# Patient Record
Sex: Female | Born: 1940
Health system: Southern US, Community
[De-identification: ages and names within clinical notes are randomized; demographics above are authoritative.]

## PROBLEM LIST (undated history)

## (undated) DIAGNOSIS — E0500 Thyrotoxicosis with diffuse goiter without thyrotoxic crisis or storm: Secondary | ICD-10-CM

## (undated) DIAGNOSIS — I341 Nonrheumatic mitral (valve) prolapse: Secondary | ICD-10-CM

## (undated) DIAGNOSIS — I4891 Unspecified atrial fibrillation: Secondary | ICD-10-CM

## (undated) DIAGNOSIS — E785 Hyperlipidemia, unspecified: Secondary | ICD-10-CM

## (undated) DIAGNOSIS — L719 Rosacea, unspecified: Secondary | ICD-10-CM

## (undated) DIAGNOSIS — K219 Gastro-esophageal reflux disease without esophagitis: Secondary | ICD-10-CM

## (undated) DIAGNOSIS — K635 Polyp of colon: Secondary | ICD-10-CM

## (undated) DIAGNOSIS — D132 Benign neoplasm of duodenum: Secondary | ICD-10-CM

## (undated) DIAGNOSIS — R413 Other amnesia: Secondary | ICD-10-CM

## (undated) DIAGNOSIS — R443 Hallucinations, unspecified: Secondary | ICD-10-CM

## (undated) DIAGNOSIS — I10 Essential (primary) hypertension: Secondary | ICD-10-CM

## (undated) DIAGNOSIS — E039 Hypothyroidism, unspecified: Secondary | ICD-10-CM

## (undated) HISTORY — PX: ESOPHAGOGASTRODUODENOSCOPY: SHX1529

## (undated) HISTORY — DX: Hypothyroidism, unspecified: E03.9

## (undated) HISTORY — PX: CATARACT EXTRACTION, BILATERAL: SHX1313

## (undated) HISTORY — DX: Hyperlipidemia, unspecified: E78.5

## (undated) HISTORY — DX: Gastro-esophageal reflux disease without esophagitis: K21.9

## (undated) HISTORY — DX: Nonrheumatic mitral (valve) prolapse: I34.1

## (undated) HISTORY — DX: Benign neoplasm of duodenum: D13.2

## (undated) HISTORY — PX: OTHER SURGICAL HISTORY: SHX169

## (undated) HISTORY — DX: Thyrotoxicosis with diffuse goiter without thyrotoxic crisis or storm: E05.00

## (undated) HISTORY — DX: Rosacea, unspecified: L71.9

## (undated) HISTORY — PX: COLONOSCOPY: SHX174

## (undated) HISTORY — DX: Polyp of colon: K63.5

## (undated) HISTORY — PX: APPENDECTOMY: SHX54

## (undated) HISTORY — PX: TUBAL LIGATION: SHX77

## (undated) HISTORY — PX: PILONIDAL CYST / SINUS EXCISION: SUR543

## (undated) HISTORY — DX: Other amnesia: R41.3

---
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
## 1998-08-05 ENCOUNTER — Other Ambulatory Visit: Admission: RE | Admit: 1998-08-05 | Discharge: 1998-08-05 | Payer: Self-pay | Admitting: Obstetrics and Gynecology

## 1999-02-16 ENCOUNTER — Ambulatory Visit (HOSPITAL_COMMUNITY): Admission: RE | Admit: 1999-02-16 | Discharge: 1999-02-16 | Payer: Self-pay | Admitting: Gastroenterology

## 2000-08-27 ENCOUNTER — Other Ambulatory Visit: Admission: RE | Admit: 2000-08-27 | Discharge: 2000-08-27 | Payer: Self-pay | Admitting: Obstetrics and Gynecology

## 2002-12-04 ENCOUNTER — Other Ambulatory Visit: Admission: RE | Admit: 2002-12-04 | Discharge: 2002-12-04 | Payer: Self-pay | Admitting: Obstetrics and Gynecology

## 2004-01-11 ENCOUNTER — Other Ambulatory Visit: Admission: RE | Admit: 2004-01-11 | Discharge: 2004-01-11 | Payer: Self-pay | Admitting: Obstetrics and Gynecology

## 2005-02-22 ENCOUNTER — Ambulatory Visit: Payer: Self-pay | Admitting: Gastroenterology

## 2005-03-01 ENCOUNTER — Ambulatory Visit (HOSPITAL_COMMUNITY): Admission: RE | Admit: 2005-03-01 | Discharge: 2005-03-01 | Payer: Self-pay | Admitting: Gastroenterology

## 2005-04-11 ENCOUNTER — Ambulatory Visit: Payer: Self-pay | Admitting: Gastroenterology

## 2005-04-19 ENCOUNTER — Ambulatory Visit: Payer: Self-pay | Admitting: Gastroenterology

## 2006-06-14 ENCOUNTER — Ambulatory Visit: Payer: Self-pay | Admitting: Gastroenterology

## 2006-07-11 ENCOUNTER — Encounter: Admission: RE | Admit: 2006-07-11 | Discharge: 2006-10-09 | Payer: Self-pay | Admitting: Gastroenterology

## 2007-06-26 ENCOUNTER — Other Ambulatory Visit: Admission: RE | Admit: 2007-06-26 | Discharge: 2007-06-26 | Payer: Self-pay | Admitting: Family Medicine

## 2008-09-14 ENCOUNTER — Encounter: Admission: RE | Admit: 2008-09-14 | Discharge: 2008-09-14 | Payer: Self-pay | Admitting: Family Medicine

## 2008-09-21 ENCOUNTER — Encounter: Admission: RE | Admit: 2008-09-21 | Discharge: 2008-09-21 | Payer: Self-pay | Admitting: Family Medicine

## 2008-10-22 ENCOUNTER — Ambulatory Visit (HOSPITAL_COMMUNITY): Admission: RE | Admit: 2008-10-22 | Discharge: 2008-10-22 | Payer: Self-pay | Admitting: Gastroenterology

## 2008-10-22 ENCOUNTER — Encounter (INDEPENDENT_AMBULATORY_CARE_PROVIDER_SITE_OTHER): Payer: Self-pay | Admitting: Gastroenterology

## 2010-01-12 ENCOUNTER — Encounter: Admission: RE | Admit: 2010-01-12 | Discharge: 2010-01-12 | Payer: Self-pay | Admitting: Gastroenterology

## 2011-02-14 NOTE — Op Note (Signed)
[name], Tracy                 ACCOUNT NO.:  [id number]   MEDICAL RECORD NO.:  [id number]          PATIENT TYPE:  AMB   LOCATION:  ENDO                         FACILITY:  [facility]   PHYSICIAN:  Bernette Redbird, M.D.   DATE OF BIRTH:  1941-04-11   DATE OF PROCEDURE:  10/22/2008  DATE OF DISCHARGE:                               OPERATIVE REPORT   PROCEDURE:  Upper endoscopy with biopsy and Bravo capsule placement.   INDICATIONS:  A 70 year old female with extreme reflux-type symptoms  which have not responded to PPI therapy.   FINDINGS:  Small duodenal polyp.   PROCEDURE:  The patient provided written consent for the procedure.  Sedation was fentanyl 75 mcg and Versed 7.5 mg IV.  The Pentax video  endoscope was passed under direct vision.  The larynx and vocal cords  looked normal.  The esophagus was entered without significant  difficulty, under direct vision, and the scope was advanced down a  normal-appearing esophagus.  There was no evidence of any hiatal hernia,  free reflux, reflux esophagitis, Barrett's esophagus, varices,  infection, neoplasia, ring or stricture.   The stomach contained a small clear residual which was suctioned up.  No  bile was seen.  The gastric mucosa was unremarkable, without evidence of  gastritis, erosions, ulcers, polyps or masses or vascular ectasia, and a  retroflexed view of the cardia was unremarkable.   The pylorus and duodenal bulb looked normal.  The second duodenum had a  small 3 x 4-mm sessile pale polyp on the edge of a fold which I biopsied  x2 to essentially complete excision.  The remainder of the second  portion of the duodenum was normal.   The scope was removed from the patient, measuring the lower esophageal  sphincter at 40 cm prior to withdrawal of the scope.   Bravo capsule placement was then performed in the standard fashion.  The  introducing device was marked at 34 cm and introduced blindly into the  esophagus and advanced  to the appropriate length.  The patient was  reendoscoped into the hypopharynx to confirm intra-esophageal location  of the introducing catheter.  Suction was then applied.  At this time,  the introducer slipped back about 2 cm, but it was felt appropriate to  carry on with the procedure rather than trying to reposition once  suction had been applied.  After 45 seconds of suctioning, the  monitoring capsule was deployed successfully, and the introducing device  was withdrawn.  The patient was reendoscoped under direct vision which  confirmed appropriate attachment of the monitoring Bravo capsule to the  esophageal wall, and the scope was then removed from the patient who  tolerated the procedure well and without apparent complication.   IMPRESSION:  1. History of epigastric pain which is of reflux character but without      endoscopic evidence of adverse reflux-related sequelae.  2. No predisposing factors for reflux identified endoscopically, such      as hiatal hernia, gastric residual, pyloric channel stenosis, etc.  3. Small duodenal polyp, biopsied.   PLAN:  1.  Await pathology on duodenal polyp.  It is endoscopically felt to be      of any significance clinically.  2. Performed Bravo capsule pH monitoring for the next 48 hours with      the patient off all antipeptic therapy.  3. Proceed to screening colonoscopy.           ______________________________  Bernette Redbird, M.D.     RB/MEDQ  D:  10/22/2008  T:  10/22/2008  Job:  [id number]   cc:   Sigmund Hazel, M.D.  Fax: [phone]

## 2011-02-14 NOTE — Op Note (Signed)
NAMENAKKIA, MACKIEWICZ                 ACCOUNT NO.:  [id number]   MEDICAL RECORD NO.:  [id number]          PATIENT TYPE:  AMB   LOCATION:  ENDO                         [facility]ACILITY:  [facility]   PHYSICIAN:  Bernette Redbird, M.D.   DATE OF BIRTH:  1941-08-26   DATE OF PROCEDURE:  10/22/2008  DATE OF DISCHARGE:                               OPERATIVE REPORT   PROCEDURE:  Colonoscopy with polypectomy.   INDICATIONS:  A 70 year old female with diffuse abdominal pain and need  for colon cancer screening.   FINDINGS:  Two small polyps removed.   PROCEDURE:  The patient provided written consent for the procedure.  Total sedation for this procedure and the upper endoscopy which preceded  it was fentanyl 125 mcg and Versed 12.5 mg IV without clinical  instability.  The Pentax adult video colonoscope was advanced without  significant difficulty around the colon to the terminal ileum which had  a normal appearance, and pullback was then performed.  The quality of  prep was excellent, and it is felt that all areas were well seen.   Adjacent to the appendiceal orifice was a 3 x 4-mm sessile polyp removed  by cold snare technique.  The pieces were retrieved by suctioning  through the scope.   In the proximal descending colon was a 3 x 4-mm sessile polyp again  removed by cold snare technique.  No other polyps were seen, and there  was no evidence of cancer, colitis, vascular malformations or  diverticulosis in the colon.  Retroflexion of the rectum and  reinspection of the rectum were unremarkable.  The patient tolerated the  procedure well, and there were no apparent complications.   IMPRESSION:  1. Two small colon polyps removed as described above.  2. No source of diffuse nonspecific abdominal pain identified.   PLAN:  Await polyp pathology with colonoscopic follow-up in 5 years if  either of polyps is adenomatous in character.           ______________________________  Bernette Redbird,  M.D.     RB/MEDQ  D:  10/22/2008  T:  10/22/2008  Job:  [id number]

## 2011-02-14 NOTE — Op Note (Signed)
Tracy [name], Tracy [name]                 ACCOUNT NO.:  [id number]   MEDICAL RECORD NO.:  [id number]          PATIENT TYPE:  AMB   LOCATION:  ENDO                         FACILITY:  [facility]   PHYSICIAN:  Bernette Redbird, M.D.   DATE OF BIRTH:  10-Nov-1940   DATE OF PROCEDURE:  10/22/2008  DATE OF DISCHARGE:  10/22/2008                               OPERATIVE REPORT   PROCEDURE:  48-hour intra-esophageal pH monitoring via Bravo capsule,  off medication.   INDICATION:  Clarification of origin of patient's symptoms.   FINDINGS:  Mildly abnormal reflux score on day 1.   PROCEDURE:  The Bravo capsule was placed in the esophagus in the  standard fashion and its position was confirmed endoscopically.   This procedure was done with the patient off all antipeptic therapy for  approximately 5 days.  She had previously been on H2 blockers, not  proton pump inhibitors, in the period of time closest to this  examination.   FINDINGS:  On day 1, the patient had a DeMeester score of 20.8 (normal  less than 14.72), with a 35-minute episode of reflux while supine.  The  predominant portion of the reflux on day 1 was in the supine position,  with a total reflux time of 4.9% (7.2% while in the supine position  versus 1.9 while upright).   On day 2, the patient had a normal DeMeester score of 5.8.  On the  second day, the reflux was primarily while the patient was upright.   IMPRESSION:  This study supports the idea that the patient is indeed  having intermittent reflux, which could well account for her reflux-like  symptoms.   PLAN:  The patient will almost certainly respond to medical therapy; it  will be a process of trial and error to find the best regimen to her.           ______________________________  Bernette Redbird, M.D.     RB/MEDQ  D:  10/30/2008  T:  10/30/2008  Job:  [id number]   cc:   Sigmund Hazel, M.D.  Fax: [phone]

## 2011-02-17 NOTE — Assessment & Plan Note (Signed)
[facility]                           GASTROENTEROLOGY OFFICE NOTE   NAME:[name], Tracy [name]                        MRN:          [id number]  DATE:06/14/2006                            DOB:          Feb 21, 1941    Tracy [name] is doing well with her acid reflux on ranitidine 300 mg twice a day.  She uses Librax p.r.n. for IBS complaints.   PHYSICAL EXAMINATION:  Exam today shows her to have gained 20 pounds of  weight, and we will refer her to [facility] for weight loss  and exercise program.  She is followed by Dr. Idell Pickles, and I will see her on  a p.r.n. basis as needed.                                   Tracy Rea. Jarold Motto, MD, [place]een [place], [place]   DRP/MedQ  DD:  06/14/2006  DT:  06/15/2006  Job #:  [id number]   cc:   Dellis Anes. Idell Pickles, M.D.

## 2012-05-16 ENCOUNTER — Other Ambulatory Visit: Payer: Self-pay

## 2012-05-16 ENCOUNTER — Other Ambulatory Visit: Payer: Self-pay | Admitting: Family Medicine

## 2012-05-16 DIAGNOSIS — R053 Chronic cough: Secondary | ICD-10-CM

## 2012-05-16 DIAGNOSIS — R05 Cough: Secondary | ICD-10-CM

## 2012-05-20 ENCOUNTER — Ambulatory Visit
Admission: RE | Admit: 2012-05-20 | Discharge: 2012-05-20 | Disposition: A | Discharge status: Home / Self Care | Payer: Medicare Other | Source: Ambulatory Visit | Attending: Family Medicine | Admitting: Family Medicine

## 2012-05-20 DIAGNOSIS — R053 Chronic cough: Secondary | ICD-10-CM

## 2012-05-20 DIAGNOSIS — R05 Cough: Secondary | ICD-10-CM

## 2012-05-20 MED ORDER — IOHEXOL 300 MG/ML  SOLN
75.0000 mL | Freq: Once | INTRAMUSCULAR | Status: AC | PRN
  Administered 2012-05-20: 75 mL via INTRAVENOUS

## 2012-06-12 ENCOUNTER — Encounter: Payer: Self-pay | Admitting: Emergency Medicine

## 2012-06-13 ENCOUNTER — Ambulatory Visit (INDEPENDENT_AMBULATORY_CARE_PROVIDER_SITE_OTHER): Payer: Medicare Other | Admitting: Emergency Medicine

## 2012-06-13 ENCOUNTER — Encounter: Payer: Self-pay | Admitting: Emergency Medicine

## 2012-06-13 VITALS — BP 110/68 | HR 69 | Temp 98.1°F | Ht 63.75 in | Wt 159.4 lb

## 2012-06-13 DIAGNOSIS — R059 Cough, unspecified: Secondary | ICD-10-CM

## 2012-06-13 DIAGNOSIS — R05 Cough: Secondary | ICD-10-CM | POA: Insufficient documentation

## 2012-06-13 DIAGNOSIS — J479 Bronchiectasis, uncomplicated: Secondary | ICD-10-CM | POA: Insufficient documentation

## 2012-06-13 DIAGNOSIS — R053 Chronic cough: Secondary | ICD-10-CM

## 2012-06-13 MED ORDER — OMEPRAZOLE 20 MG PO CPDR: 20.0000 mg | DELAYED_RELEASE_CAPSULE | Freq: Every day | ORAL | Status: DC

## 2012-06-13 MED ORDER — MOMETASONE FUROATE 50 MCG/ACT NA SUSP: 2.0000 | Freq: Every day | NASAL | Status: DC

## 2012-06-13 MED ORDER — LORATADINE 10 MG PO TABS: 10.0000 mg | ORAL_TABLET | Freq: Every day | ORAL | Status: DC

## 2012-06-13 NOTE — Progress Notes (Signed)
Subjective:    Patient ID: Tracy [name], female    DOB: Oct 03, 1940, 71 y.o.   MRN: [id number]  HPI 48 woman, hx tobacco 20 pk-yrs, hx allergies and hyperlipidemia, GERD, hypothyroidism, referred by Dr Barbaraann Barthel for chronic cough. She has seen Dr Barbaraann Barthel for recurrent episodic cough, underwent CT scan chest in 8/13 that showed subpleural scar as well as some anterior subpleural GGI and nodularity, bronchiectasis. Her episodes are characterized by productive cough - usually green to beige. She has severe LPR, has had a dry hacking cough for years. The episodes are associated with more congestion and nasal gtt. Takes nasaonex prn - very rarely.     Review of Systems  Constitutional: Negative for fever, chills, diaphoresis, activity change, appetite change, fatigue and unexpected weight change.  HENT: Positive for sore throat, sneezing and postnasal drip. Negative for hearing loss, ear pain, nosebleeds, congestion, facial swelling, rhinorrhea, mouth sores, trouble swallowing, neck pain, neck stiffness, dental problem, voice change, sinus pressure, tinnitus and ear discharge.   Eyes: Negative for photophobia, discharge, itching and visual disturbance.  Respiratory: Positive for cough and shortness of breath. Negative for choking, chest tightness and wheezing.   Cardiovascular: Negative for chest pain, palpitations and leg swelling.  Gastrointestinal: Negative for nausea, vomiting, abdominal pain, constipation and blood in stool.  Genitourinary: Negative for difficulty urinating.  Musculoskeletal: Positive for arthralgias. Negative for myalgias, back pain, joint swelling and gait problem.  Skin: Negative for rash.  Neurological: Positive for headaches. Negative for dizziness, tremors, syncope, weakness, light-headedness and numbness.  Hematological: Does not bruise/bleed easily.  Psychiatric/Behavioral: Negative for confusion, disturbed wake/sleep cycle and agitation. The patient is not nervous/anxious.     Past Medical History  Diagnosis Date  . Mitral valve prolapse   . GERD (gastroesophageal reflux disease)   . Graves disease   . Hyperlipidemia   . Rosacea   . Hypothyroidism   . Colon polyps   . Duodenal adenoma   . LPRD (laryngopharyngeal reflux disease)      Family History  Problem Relation Age of Onset  . Cancer Father     lung  . Cancer Mother     lung  . Cancer Paternal Grandfather     stomach  . Diabetes Paternal Grandmother   . Kidney failure Paternal Grandmother   . Heart attack Maternal Grandfather   . Cancer Brother     lung     History   Social History  . Marital Status: Widowed    Spouse Name: N/A    Number of Children: 3  . Years of Education: N/A   Occupational History  . retired     Diplomatic Services operational officer   Social History Main Topics  . Smoking status: Former Smoker -- 0.9 packs/day for 25 years    Types: Cigarettes    Quit date: 12/28/1981  . Smokeless tobacco: Never Used  . Alcohol Use: Yes     several  . Drug Use: No  . Sexually Active: Not on file   Other Topics Concern  . Not on file   Social History Narrative  . No narrative on file     Allergies  Allergen Reactions  . Avelox (Moxifloxacin Hcl In Nacl)   . Other     Surgical Tape  . Tussionex Pennkinetic Er (Hydrocod Polst-Cpm Polst Er)   . Codeine Itching, Nausea And Vomiting and Rash    ALL FORMS OF CODEINE  . Penicillins Itching and Rash    ALL FORMS OF PENICILLINS  Outpatient Prescriptions Prior to Visit  Medication Sig Dispense Refill  . Acetaminophen (TYLENOL PO) Take 1 tablet by mouth daily as needed.      Marland Kitchen albuterol (PROVENTIL HFA;VENTOLIN HFA) 108 (90 BASE) MCG/ACT inhaler Inhale 2 puffs into the lungs every 6 (six) hours as needed.      . Calcium Carbonate-Vitamin D (CALCIUM-VITAMIN D) 600-200 MG-UNIT CAPS Take 1 tablet by mouth 2 (two) times daily.       Marland Kitchen ezetimibe-simvastatin (VYTORIN) 10-40 MG per tablet Take 1 tablet by mouth daily.      Marland Kitchen levothyroxine  (SYNTHROID, LEVOTHROID) 75 MCG tablet Take 75 mcg by mouth daily.      . Multiple Vitamin (MULTIVITAMIN) capsule Take 1 capsule by mouth daily.      . ranitidine (ZANTAC) 300 MG tablet Take 300 mg by mouth 2 (two) times daily. Take 1-2 tablets BID      . azithromycin (ZITHROMAX) 250 MG tablet Take 250 mg by mouth daily.      . benzonatate (TESSALON) 100 MG capsule Take 100 mg by mouth 3 (three) times daily as needed.             Objective:   Physical Exam Filed Vitals:   06/13/12 1638  BP: 110/68  Pulse: 69  Temp: 98.1 F (36.7 C)   Gen: Pleasant, overwt woman, in no distress,  normal affect  ENT: No lesions,  mouth clear,  oropharynx clear but erythematous  Neck: No JVD, no TMG, no carotid bruits, no stridor  Lungs: No use of accessory muscles, no dullness to percussion, clear without rales or rhonchi  Cardiovascular: RRR, heart sounds normal, no murmur or gallops, no peripheral edema  Musculoskeletal: No deformities, no cyanosis or clubbing  Neuro: alert, non focal  Skin: Warm, no lesions or rashes      Assessment & Plan:

## 2012-06-13 NOTE — Patient Instructions (Addendum)
Start taking omeprazole 20mg  daily Continue your ranitidine as you are taking it Start taking your nasonex 2 sprays each nostril daily Start taking loratadine 10mg  daily If you have a recurrence of your productive cough, call our office and we will collect a sputum sample.  Follow with Dr Delton Coombes in 6 weeks or sooner if you have any problems

## 2012-06-14 NOTE — Assessment & Plan Note (Addendum)
Multifactorial with two identifiable phases - dry cough that is longstanding and likely related to GERD/reflux, and also a more productive cough that may relate to allergic rhinitis and congestion as well as contribution of apparent bronchiectatic changes on CT scan.  - will attempt to more aggressively rx her GERD, add PPI to H2 blocker - add nasal steroid and loratadine - consider nasal saline washes - need to sort out contribution of her bronchiectasis, ? Possible atypical mycobacterial colonization/infxn. Asked her to come in for sputum cx when her cough becomes productive to r/o AFB. If unable then we may consider FOB

## 2012-06-14 NOTE — Assessment & Plan Note (Signed)
Abnormal CXR with regions of apparent mild subpleural scar and also other regions of bronchiectatic change. ? Whether she has chronic microaspiration due to reflux, which could cause both findings. Will need to eval for AFB as above. Follow radiographically and clinically. Consider auto-immune workup.

## 2012-07-29 ENCOUNTER — Encounter: Payer: Self-pay | Admitting: Emergency Medicine

## 2012-07-29 ENCOUNTER — Ambulatory Visit (INDEPENDENT_AMBULATORY_CARE_PROVIDER_SITE_OTHER): Payer: Medicare Other | Admitting: Emergency Medicine

## 2012-07-29 VITALS — BP 110/72 | HR 76 | Temp 97.6°F | Ht 64.5 in | Wt 160.2 lb

## 2012-07-29 DIAGNOSIS — R05 Cough: Secondary | ICD-10-CM

## 2012-07-29 DIAGNOSIS — R053 Chronic cough: Secondary | ICD-10-CM

## 2012-07-29 DIAGNOSIS — J479 Bronchiectasis, uncomplicated: Secondary | ICD-10-CM

## 2012-07-29 DIAGNOSIS — R059 Cough, unspecified: Secondary | ICD-10-CM

## 2012-07-29 NOTE — Progress Notes (Signed)
  Subjective:    Patient ID: Tracy [name], female    DOB: [birth year], 71 y.o.   MRN: [id number]  HPI 71 woman, hx tobacco 20 pk-yrs, hx allergies and hyperlipidemia, GERD, hypothyroidism, referred by Dr Barbaraann Barthel for chronic cough. She has seen Dr Barbaraann Barthel for recurrent episodic cough, underwent CT scan chest in 8/13 that showed subpleural scar as well as some anterior subpleural GGI and nodularity, bronchiectasis. Her episodes are characterized by productive cough - usually green to beige. She has severe LPR, has had a dry hacking cough for years. The episodes are associated with more congestion and nasal gtt. Takes nasaonex prn - very rarely.    ROV 07/29/12 -- follow up for cough, probably due to GERD but also to bronchiectasis. Las time we added omeprazole to h2 blockade, added loratadine to nasal steroid. She did not bring any sputum to send for AFB and other cx's. She didn't notice any change in her dry cough on the omeprazole. She stopped loratadine and nasal steroid. She stopped all three meds because she was "getting the shakes". She has not used albuterol. Some limitation with household chores, walking uphill.      Objective:   Physical Exam Filed Vitals:   07/29/12 1037  BP: 110/72  Pulse: 76  Temp: 97.6 F (36.4 C)   Gen: Pleasant, overwt woman, in no distress,  normal affect  ENT: No lesions,  mouth clear,  oropharynx clear but erythematous  Neck: No JVD, no TMG, no carotid bruits, no stridor  Lungs: No use of accessory muscles, no dullness to percussion, clear without rales or rhonchi  Cardiovascular: RRR, heart sounds normal, no murmur or gallops, no peripheral edema  Musculoskeletal: No deformities, no cyanosis or clubbing  Neuro: alert, non focal  Skin: Warm, no lesions or rashes      Assessment & Plan:  Chronic cough Clearly has contribution of allergic rhinitis, but she hasn't tolerated loratadine, wants to use nasonex only prn. She stopped omeprazole as well.  She will have to balance using these meds vs side effects, decide if she wants to maximize therapy for her cough  Bronchiectasis - We will try again to organize sputum collection - repeat Ct scan in 05/2013 or sooner if any new pulm sx.

## 2012-07-29 NOTE — Patient Instructions (Addendum)
We will give you a cup to collect a sputum sample.  We will plan to repeat your CT scan of the chest in 05/2013 or sooner if you develop any new symptoms.  Stay off the omeprazole, loratadine, and Nasonex Follow with Dr Delton Coombes in 6 months or sooner if you have any problems

## 2012-07-29 NOTE — Assessment & Plan Note (Signed)
Clearly has contribution of allergic rhinitis, but she hasn't tolerated loratadine, wants to use nasonex only prn. She stopped omeprazole as well. She will have to balance using these meds vs side effects, decide if she wants to maximize therapy for her cough

## 2012-07-29 NOTE — Assessment & Plan Note (Signed)
-   We will try again to organize sputum collection - repeat Ct scan in 05/2013 or sooner if any new pulm sx.

## 2012-09-04 DIAGNOSIS — Z961 Presence of intraocular lens: Secondary | ICD-10-CM | POA: Insufficient documentation

## 2013-01-16 ENCOUNTER — Ambulatory Visit: Payer: Medicare Other | Admitting: Emergency Medicine

## 2013-01-30 ENCOUNTER — Ambulatory Visit (INDEPENDENT_AMBULATORY_CARE_PROVIDER_SITE_OTHER): Payer: Medicare Other | Admitting: Emergency Medicine

## 2013-01-30 ENCOUNTER — Encounter: Payer: Self-pay | Admitting: Emergency Medicine

## 2013-01-30 VITALS — BP 100/60 | HR 63 | Temp 97.8°F | Ht 64.0 in | Wt 163.0 lb

## 2013-01-30 DIAGNOSIS — R053 Chronic cough: Secondary | ICD-10-CM

## 2013-01-30 DIAGNOSIS — J479 Bronchiectasis, uncomplicated: Secondary | ICD-10-CM

## 2013-01-30 DIAGNOSIS — R059 Cough, unspecified: Secondary | ICD-10-CM

## 2013-01-30 DIAGNOSIS — R05 Cough: Secondary | ICD-10-CM

## 2013-01-30 MED ORDER — FLUTICASONE PROPIONATE 50 MCG/ACT NA SUSP: 2.0000 | Freq: Two times a day (BID) | NASAL | Status: DC

## 2013-01-30 NOTE — Assessment & Plan Note (Signed)
Change nasonex to fluticasone bid continue loratadine daily (she takes at night)

## 2013-01-30 NOTE — Progress Notes (Signed)
  Subjective:    Patient ID: Tracy [name], female    DOB: [birth year], 72 y.o.   MRN: [id number]  HPI 39 woman, hx tobacco 20 pk-yrs, hx allergies and hyperlipidemia, GERD, hypothyroidism, referred by Dr Barbaraann Barthel for chronic cough. She has seen Dr Barbaraann Barthel for recurrent episodic cough, underwent CT scan chest in 8/13 that showed subpleural scar as well as some anterior subpleural GGI and nodularity, bronchiectasis. Her episodes are characterized by productive cough - usually green to beige. She has severe LPR, has had a dry hacking cough for years. The episodes are associated with more congestion and nasal gtt. Takes nasaonex prn - very rarely.    ROV 07/29/12 -- follow up for cough, probably due to GERD but also to bronchiectasis. Las time we added omeprazole to h2 blockade, added loratadine to nasal steroid. She did not bring any sputum to send for AFB and other cx's. She didn't notice any change in her dry cough on the omeprazole. She stopped loratadine and nasal steroid. She stopped all three meds because she was "getting the shakes". She has not used albuterol. Some limitation with household chores, walking uphill.   ROV 01/30/13 -- cough, probably due to GERD but also to bronchiectasis by CT scan 8/13. Has been treated before with omeprazole, didn't tolerate. She is on ranitidine. Loratadine making her dizzy. Using Nasonex nasal spray qd. She tried to give sputum sample but it was too small. Her cough is stable, maybe better than last visit.      Objective:   Physical Exam Filed Vitals:   01/30/13 1350  BP: 100/60  Pulse: 63  Temp: 97.8 F (36.6 C)   Gen: Pleasant, overwt woman, in no distress,  normal affect  ENT: No lesions,  mouth clear,  oropharynx clear but erythematous  Neck: No JVD, no TMG, no carotid bruits, no stridor  Lungs: No use of accessory muscles, no dullness to percussion, clear without rales or rhonchi  Cardiovascular: RRR, heart sounds normal, no murmur or gallops, no  peripheral edema  Musculoskeletal: No deformities, no cyanosis or clubbing  Neuro: alert, non focal  Skin: Warm, no lesions or rashes      Assessment & Plan:  Bronchiectasis - repeat Ct scan chest in 8/14; she wants to see the cost to her before going along with it  Chronic cough Change nasonex to fluticasone bid continue loratadine daily (she takes at night)

## 2013-01-30 NOTE — Assessment & Plan Note (Signed)
-   repeat Ct scan chest in 8/14; she wants to see the cost to her before going along with it

## 2013-01-30 NOTE — Patient Instructions (Addendum)
We will perform your CT scan in 05/2013 after investigating the cost to you Continue yoru loratadine at night Stop nasonex Start fluticasone nasal spray 2 sprays each nostril twice a day Follow with Dr Delton Coombes in 05/2013 after your scan to review

## 2013-05-26 ENCOUNTER — Ambulatory Visit (INDEPENDENT_AMBULATORY_CARE_PROVIDER_SITE_OTHER)
Admission: RE | Admit: 2013-05-26 | Discharge: 2013-05-26 | Disposition: A | Discharge status: Home / Self Care | Payer: Medicare Other | Source: Ambulatory Visit | Attending: Emergency Medicine | Admitting: Emergency Medicine

## 2013-05-26 DIAGNOSIS — J479 Bronchiectasis, uncomplicated: Secondary | ICD-10-CM

## 2013-05-29 NOTE — Progress Notes (Signed)
Quick Note:  Spoke with patient, patient aware of results and understands this will be discussed tomorrow at her visit. Nothing further at this time. ______

## 2013-05-30 ENCOUNTER — Encounter: Payer: Self-pay | Admitting: Emergency Medicine

## 2013-05-30 ENCOUNTER — Ambulatory Visit (INDEPENDENT_AMBULATORY_CARE_PROVIDER_SITE_OTHER): Payer: Medicare Other | Admitting: Emergency Medicine

## 2013-05-30 VITALS — BP 140/74 | HR 66 | Ht 64.0 in | Wt 163.0 lb

## 2013-05-30 DIAGNOSIS — J479 Bronchiectasis, uncomplicated: Secondary | ICD-10-CM

## 2013-05-30 MED ORDER — AEROCHAMBER MV MISC: Status: AC

## 2013-05-30 NOTE — Patient Instructions (Addendum)
We will discuss and reconsider bronchoscopy in the future if you feel worse or if your CXR changes.  We will add a spacer to your albuterol for you to use as needed Follow with Dr Delton Coombes in 6 months or sooner if you have any problems

## 2013-05-30 NOTE — Progress Notes (Signed)
Subjective:    Patient ID: Tracy [name], female    DOB: 12/07/1940, 72 y.o.   MRN: [id number]  HPI 59 woman, hx tobacco 20 pk-yrs, hx allergies and hyperlipidemia, GERD, hypothyroidism, referred by Dr Barbaraann Barthel for chronic cough. She has seen Dr Barbaraann Barthel for recurrent episodic cough, underwent CT scan chest in 8/13 that showed subpleural scar as well as some anterior subpleural GGI and nodularity, bronchiectasis. Her episodes are characterized by productive cough - usually green to beige. She has severe LPR, has had a dry hacking cough for years. The episodes are associated with more congestion and nasal gtt. Takes nasaonex prn - very rarely.    ROV 07/29/12 -- follow up for cough, probably due to GERD but also to bronchiectasis. Las time we added omeprazole to h2 blockade, added loratadine to nasal steroid. She did not bring any sputum to send for AFB and other cx's. She didn't notice any change in her dry cough on the omeprazole. She stopped loratadine and nasal steroid. She stopped all three meds because she was "getting the shakes". She has not used albuterol. Some limitation with household chores, walking uphill.   ROV 01/30/13 -- cough, probably due to GERD but also to bronchiectasis by CT scan 8/13. Has been treated before with omeprazole, didn't tolerate. She is on ranitidine. Loratadine making her dizzy. Using Nasonex nasal spray qd. She tried to give sputum sample but it was too small. Her cough is stable, maybe better than last visit.   05/30/13 -- cough, probably due to GERD but also to bronchiectasis by CT scan 8/13. Repeated CT scan now to assess bronchiectasis > acute inflammatory changes improved, residual bronchiectasis and interstitial dz. She continues to have cough, maybe a bit better. She is having a bit more dyspnea this Summer w walking. Her sputum sample was rejected, so AFL couldn't be checked. Auto-immune labs haven't been checked.      Objective:   Physical Exam Filed Vitals:   05/30/13 1421  BP: 140/74  Pulse: 66  Height: 5\' 4"  (1.626 m)  Weight: 163 lb (73.936 kg)  SpO2: 98%   Gen: Pleasant, overwt woman, in no distress,  normal affect  ENT: No lesions,  mouth clear,  oropharynx clear but erythematous  Neck: No JVD, no TMG, no carotid bruits, no stridor  Lungs: No use of accessory muscles, no dullness to percussion, clear without rales or rhonchi  Cardiovascular: RRR, heart sounds normal, no murmur or gallops, no peripheral edema  Musculoskeletal: No deformities, no cyanosis or clubbing  Neuro: alert, non focal  Skin: Warm, no lesions or rashes    05/29/13 --  Comparison: Chest CT 05/20/2012.  Findings:  Mediastinum: Heart size is normal. There is no significant  pericardial fluid, thickening or pericardial calcification. There  is atherosclerosis of the thoracic aorta, the great vessels of the  mediastinum and the coronary arteries, including calcified  atherosclerotic plaque in the left anterior descending coronary  artery. No pathologically enlarged mediastinal or hilar lymph  nodes. Please note that accurate exclusion of hilar adenopathy is  limited on noncontrast CT scans. Esophagus is unremarkable in  appearance.  Lungs/Pleura: Previously noted 7 mm ground-glass attenuation nodule  in the medial aspect of the right upper lobe (image 28 of series 3  of the prior examination 05/20/2012) has completely resolved,  compatible with an infectious or inflammatory etiology on the prior  study. There continues to be a few scattered tiny 1-2 mm  peripheral nodules in the lungs bilaterally, favored  to represent  areas of chronic mucoid impaction within terminal bronchioles.  Mild diffuse bronchial wall thickening. Patchy areas of very mild  cylindrical bronchiectasis, most pronounced in the left lower lobe.  There are some patchy areas of ground-glass attenuation and  subpleural reticulation throughout the lower lobes of the lungs  bilaterally. No  frank honeycombing is identified at this time.  Inspiratory and expiratory imaging demonstrates some mild air  trapping on the expiratory phase, suggesting small airways disease.  Upper Abdomen: Unremarkable.  Musculoskeletal: There are no aggressive appearing lytic or blastic  lesions noted in the visualized portions of the skeleton.  IMPRESSION:  1. All previously noted nodules have resolved compared to the  prior study, indicative of infectious or inflammatory etiologies on  the prior examination.  2. The appearance of the lungs is again suggestive of an  underlying interstitial lung disease. Given the lack of  significant temporal progression compared to the prior study, and  the lack of frank honeycombing on today's examination, findings are  favored to reflect mild nonspecific interstitial pneumonia (NSIP).  A follow-up high-resolution chest CT 1 year would be useful to  monitor for temporal progression of disease if clinically  indicated.  3. Atherosclerosis, including left anterior descending coronary  artery disease. Assessment for potential risk factor modification,  dietary therapy or pharmacologic therapy may be warranted, if  clinically indicated.  4. Mild air trapping.  5. Additional incidental findings, as above.      Assessment & Plan:  Bronchiectasis Stable inflammatory interstitial changes on CT scan, etiology unclear, considering AFB.  - she would like to defer bronchoscopy and BAL at this time. Will reconsider if she clinically changes.  - follow CXR  - rov 6

## 2013-05-30 NOTE — Assessment & Plan Note (Signed)
Stable inflammatory interstitial changes on CT scan, etiology unclear, considering AFB.  - she would like to defer bronchoscopy and BAL at this time. Will reconsider if she clinically changes.  - follow CXR  - rov 6

## 2013-05-30 NOTE — Addendum Note (Signed)
Addended by: Orma Flaming D on: 05/30/2013 02:53 PM   Modules accepted: Orders

## 2013-08-04 ENCOUNTER — Other Ambulatory Visit: Payer: Self-pay | Admitting: Emergency Medicine

## 2015-12-01 DIAGNOSIS — I4891 Unspecified atrial fibrillation: Secondary | ICD-10-CM

## 2015-12-01 HISTORY — DX: Unspecified atrial fibrillation: I48.91

## 2015-12-04 ENCOUNTER — Encounter (HOSPITAL_COMMUNITY): Payer: Self-pay | Admitting: Emergency Medicine

## 2015-12-04 ENCOUNTER — Emergency Department (HOSPITAL_COMMUNITY): Payer: Medicare Other

## 2015-12-04 ENCOUNTER — Inpatient Hospital Stay (HOSPITAL_COMMUNITY)
Admission: EM | Admit: 2015-12-04 | Discharge: 2015-12-16 | DRG: 336 | Disposition: A | Discharge status: Home / Self Care | Payer: Medicare Other | Attending: Surgery | Admitting: Surgery

## 2015-12-04 DIAGNOSIS — K565 Intestinal adhesions [bands] with obstruction (postprocedural) (postinfection): Principal | ICD-10-CM | POA: Diagnosis present

## 2015-12-04 DIAGNOSIS — Z79899 Other long term (current) drug therapy: Secondary | ICD-10-CM

## 2015-12-04 DIAGNOSIS — J90 Pleural effusion, not elsewhere classified: Secondary | ICD-10-CM | POA: Diagnosis not present

## 2015-12-04 DIAGNOSIS — R188 Other ascites: Secondary | ICD-10-CM | POA: Diagnosis present

## 2015-12-04 DIAGNOSIS — E785 Hyperlipidemia, unspecified: Secondary | ICD-10-CM | POA: Diagnosis present

## 2015-12-04 DIAGNOSIS — R112 Nausea with vomiting, unspecified: Secondary | ICD-10-CM | POA: Diagnosis not present

## 2015-12-04 DIAGNOSIS — Z87891 Personal history of nicotine dependence: Secondary | ICD-10-CM

## 2015-12-04 DIAGNOSIS — Z7951 Long term (current) use of inhaled steroids: Secondary | ICD-10-CM

## 2015-12-04 DIAGNOSIS — Z0189 Encounter for other specified special examinations: Secondary | ICD-10-CM

## 2015-12-04 DIAGNOSIS — K56609 Unspecified intestinal obstruction, unspecified as to partial versus complete obstruction: Secondary | ICD-10-CM

## 2015-12-04 DIAGNOSIS — K219 Gastro-esophageal reflux disease without esophagitis: Secondary | ICD-10-CM | POA: Diagnosis present

## 2015-12-04 DIAGNOSIS — R0600 Dyspnea, unspecified: Secondary | ICD-10-CM

## 2015-12-04 DIAGNOSIS — K5900 Constipation, unspecified: Secondary | ICD-10-CM | POA: Diagnosis present

## 2015-12-04 DIAGNOSIS — I341 Nonrheumatic mitral (valve) prolapse: Secondary | ICD-10-CM | POA: Diagnosis present

## 2015-12-04 DIAGNOSIS — R0602 Shortness of breath: Secondary | ICD-10-CM | POA: Diagnosis present

## 2015-12-04 DIAGNOSIS — E039 Hypothyroidism, unspecified: Secondary | ICD-10-CM | POA: Diagnosis present

## 2015-12-04 DIAGNOSIS — I4891 Unspecified atrial fibrillation: Secondary | ICD-10-CM | POA: Diagnosis not present

## 2015-12-04 HISTORY — DX: Unspecified atrial fibrillation: I48.91

## 2015-12-04 LAB — URINE MICROSCOPIC-ADD ON

## 2015-12-04 LAB — COMPREHENSIVE METABOLIC PANEL
ALBUMIN: 4.2 g/dL (ref 3.5–5.0)
ALK PHOS: 55 U/L (ref 38–126)
ALT: 19 U/L (ref 14–54)
AST: 28 U/L (ref 15–41)
Anion gap: 15 (ref 5–15)
BUN: 13 mg/dL (ref 6–20)
CALCIUM: 9.3 mg/dL (ref 8.9–10.3)
CO2: 24 mmol/L (ref 22–32)
CREATININE: 0.82 mg/dL (ref 0.44–1.00)
Chloride: 102 mmol/L (ref 101–111)
Glucose, Bld: 119 mg/dL — ABNORMAL HIGH (ref 65–99)
Potassium: 3.7 mmol/L (ref 3.5–5.1)
SODIUM: 141 mmol/L (ref 135–145)
TOTAL PROTEIN: 6.6 g/dL (ref 6.5–8.1)
Total Bilirubin: 0.9 mg/dL (ref 0.3–1.2)

## 2015-12-04 LAB — URINALYSIS, ROUTINE W REFLEX MICROSCOPIC
Glucose, UA: NEGATIVE mg/dL
Ketones, ur: 40 mg/dL — AB
LEUKOCYTES UA: NEGATIVE
NITRITE: NEGATIVE
PROTEIN: NEGATIVE mg/dL
Specific Gravity, Urine: 1.019 (ref 1.005–1.030)
pH: 5 (ref 5.0–8.0)

## 2015-12-04 LAB — CBC
HCT: 41.8 % (ref 36.0–46.0)
Hemoglobin: 13.7 g/dL (ref 12.0–15.0)
MCH: 31.5 pg (ref 26.0–34.0)
MCHC: 32.8 g/dL (ref 30.0–36.0)
MCV: 96.1 fL (ref 78.0–100.0)
PLATELETS: 208 10*3/uL (ref 150–400)
RBC: 4.35 MIL/uL (ref 3.87–5.11)
RDW: 12.1 % (ref 11.5–15.5)
WBC: 8.2 10*3/uL (ref 4.0–10.5)

## 2015-12-04 LAB — LIPASE, BLOOD: Lipase: 24 U/L (ref 11–51)

## 2015-12-04 MED ORDER — ONDANSETRON HCL 4 MG/2ML IJ SOLN
4.0000 mg | Freq: Once | INTRAMUSCULAR | Status: AC
  Administered 2015-12-05: 4 mg via INTRAVENOUS
  Filled 2015-12-04: qty 2

## 2015-12-04 MED ORDER — ONDANSETRON 4 MG PO TBDP
4.0000 mg | ORAL_TABLET | Freq: Once | ORAL | Status: AC | PRN
  Administered 2015-12-04: 4 mg via ORAL

## 2015-12-04 MED ORDER — ONDANSETRON 4 MG PO TBDP
ORAL_TABLET | ORAL | Status: AC
  Filled 2015-12-04: qty 1

## 2015-12-04 MED ORDER — IOHEXOL 300 MG/ML  SOLN
100.0000 mL | Freq: Once | INTRAMUSCULAR | Status: AC | PRN
  Administered 2015-12-04: 100 mL via INTRAVENOUS

## 2015-12-04 NOTE — ED Provider Notes (Signed)
CSN: [id number]     Arrival date & time 12/04/15  2013 History   First MD Initiated Contact with Patient 12/04/15 2038     Chief Complaint  Patient presents with  . Abdominal Pain     (Consider location/radiation/quality/duration/timing/severity/associated sxs/prior Treatment) HPI Comments: Patient is a 75 year old female with past medical history of reflux, hypothyroidism. She presents for evaluation of lower abdominal discomfort she states is been ongoing for the past 2 weeks. It became much worse this afternoon. She denies any fevers or chills. She denies any vomiting or diarrhea. She does report some constipation recently.  Patient is a 75 y.o. female presenting with abdominal pain. The history is provided by the patient.  Abdominal Pain Pain location:  LLQ and RLQ Pain quality: bloating and cramping   Pain radiates to:  Does not radiate Pain severity:  Moderate Onset quality:  Sudden Duration:  2 weeks Timing:  Intermittent Progression:  Worsening Chronicity:  New Relieved by:  Nothing Worsened by:  Nothing tried Ineffective treatments:  None tried   Past Medical History  Diagnosis Date  . Mitral valve prolapse   . GERD (gastroesophageal reflux disease)   . Graves disease   . Hyperlipidemia   . Rosacea   . Hypothyroidism   . Colon polyps   . Duodenal adenoma   . LPRD (laryngopharyngeal reflux disease)    Past Surgical History  Procedure Laterality Date  . Cataract extraction, bilateral    . Ruptured disk    . Pilonidal cyst / sinus excision    . Tubal ligation    . Colonoscopy      x2  . Esophagogastroduodenoscopy      x2  . Appendectomy     Family History  Problem Relation Age of Onset  . Cancer Father     lung  . Cancer Mother     lung  . Cancer Paternal Grandfather     stomach  . Diabetes Paternal Grandmother   . Kidney failure Paternal Grandmother   . Heart attack Maternal Grandfather   . Cancer Brother     lung   Social History  Substance  Use Topics  . Smoking status: Former Smoker -- 0.90 packs/day for 25 years    Types: Cigarettes    Quit date: 12/28/1981  . Smokeless tobacco: Never Used  . Alcohol Use: Yes     Comment: several   OB History    No data available     Review of Systems  Gastrointestinal: Positive for abdominal pain.  All other systems reviewed and are negative.     Allergies  Avelox; Other; Tussionex pennkinetic er; Codeine; and Penicillins  Home Medications   Prior to Admission medications   Medication Sig Start Date End Date Taking? Authorizing Provider  Acetaminophen (TYLENOL PO) Take 1 tablet by mouth daily as needed.    Historical Provider, MD  albuterol (PROVENTIL HFA;VENTOLIN HFA) 108 (90 BASE) MCG/ACT inhaler Inhale 2 puffs into the lungs every 6 (six) hours as needed.    Historical Provider, MD  Calcium Carbonate-Vitamin D (CALCIUM-VITAMIN D) 600-200 MG-UNIT CAPS Take 1 tablet by mouth 2 (two) times daily.     Historical Provider, MD  ezetimibe-simvastatin (VYTORIN) 10-40 MG per tablet Take 1 tablet by mouth daily.    Historical Provider, MD  fluticasone (FLONASE) 50 MCG/ACT nasal spray Place 2 sprays into the nose 2 (two) times daily. 01/30/13   Collene Gobble, MD  levothyroxine (SYNTHROID, LEVOTHROID) 75 MCG tablet Take 75 mcg by mouth  daily.    Historical Provider, MD  MetroNIDAZOLE (METROGEL EX) Apply topically. For rosacea    Historical Provider, MD  Multiple Vitamin (MULTIVITAMIN) capsule Take 1 capsule by mouth daily.    Historical Provider, MD  QC LORATADINE ALLERGY RELIEF 10 MG tablet TAKE 1 TABLET (10 MG TOTAL) BY MOUTH DAILY. 08/04/13   Collene Gobble, MD  ranitidine (ZANTAC) 300 MG tablet Take 300 mg by mouth 2 (two) times daily. Take 1-2 tablets BID    Historical Provider, MD  Spacer/Aero-Holding Chambers (AEROCHAMBER MV) inhaler Use as instructed 05/30/13   Collene Gobble, MD  tolnaftate (TINACTIN) 1 % spray Apply topically at bedtime.    Historical Provider, MD   BP 122/78  mmHg  Pulse 64  Temp(Src) 97.6 F (36.4 C) (Oral)  Resp 16  Ht 5\' 4"  (1.626 m)  Wt 162 lb 5 oz (73.624 kg)  BMI 27.85 kg/m2  SpO2 98% Physical Exam  Constitutional: She is oriented to person, place, and time. She appears well-developed and well-nourished. No distress.  HENT:  Head: Normocephalic and atraumatic.  Neck: Normal range of motion. Neck supple.  Cardiovascular: Normal rate and regular rhythm.  Exam reveals no gallop and no friction rub.   No murmur heard. Pulmonary/Chest: Effort normal and breath sounds normal. No respiratory distress. She has no wheezes.  Abdominal: Soft. Bowel sounds are normal. She exhibits no distension. There is tenderness. There is no rebound and no guarding.  There is tenderness to palpation in the lower abdomen in the left lower quadrant, right lower quadrant, and suprapubic region.  Musculoskeletal: Normal range of motion.  Neurological: She is alert and oriented to person, place, and time.  Skin: Skin is warm and dry. She is not diaphoretic.  Nursing note and vitals reviewed.   ED Course  Procedures (including critical care time) Labs Review Labs Reviewed  CBC  LIPASE, BLOOD  COMPREHENSIVE METABOLIC PANEL  URINALYSIS, ROUTINE W REFLEX MICROSCOPIC (NOT AT [facility])    Imaging Review No results found. I have personally reviewed and evaluated these images and lab results as part of my medical decision-making.    MDM   Final diagnoses:  None    Patient presents with complaints of abdominal discomfort and cramping. Her laboratory studies are unremarkable, however her CT scan shows a small bowel obstruction likely related to adhesions. She has vomited in the emergency department and an NG tube will be placed. I discussed the case with Dr. Rosendo Gros from general surgery who is recommending the patient be admitted to the hospitalist service with surgery consultation. Dr. Tamala Julian from the hospitalist service agrees to admit.    Veryl Speak,  MD 12/05/15 [phone]

## 2015-12-04 NOTE — ED Notes (Addendum)
Pt reports generalized abdominal pain-mostly lower abdomen with nausea x 2 weeks. sts her BM have been abnormally difficult.

## 2015-12-05 ENCOUNTER — Inpatient Hospital Stay (HOSPITAL_COMMUNITY): Payer: Medicare Other

## 2015-12-05 DIAGNOSIS — I4891 Unspecified atrial fibrillation: Secondary | ICD-10-CM | POA: Diagnosis not present

## 2015-12-05 DIAGNOSIS — E039 Hypothyroidism, unspecified: Secondary | ICD-10-CM

## 2015-12-05 DIAGNOSIS — K219 Gastro-esophageal reflux disease without esophagitis: Secondary | ICD-10-CM | POA: Diagnosis present

## 2015-12-05 DIAGNOSIS — K5900 Constipation, unspecified: Secondary | ICD-10-CM | POA: Diagnosis present

## 2015-12-05 DIAGNOSIS — I341 Nonrheumatic mitral (valve) prolapse: Secondary | ICD-10-CM | POA: Diagnosis present

## 2015-12-05 DIAGNOSIS — Z79899 Other long term (current) drug therapy: Secondary | ICD-10-CM | POA: Diagnosis not present

## 2015-12-05 DIAGNOSIS — K56609 Unspecified intestinal obstruction, unspecified as to partial versus complete obstruction: Secondary | ICD-10-CM | POA: Diagnosis present

## 2015-12-05 DIAGNOSIS — Z7951 Long term (current) use of inhaled steroids: Secondary | ICD-10-CM | POA: Diagnosis not present

## 2015-12-05 DIAGNOSIS — Z87891 Personal history of nicotine dependence: Secondary | ICD-10-CM | POA: Diagnosis not present

## 2015-12-05 DIAGNOSIS — R1084 Generalized abdominal pain: Secondary | ICD-10-CM | POA: Diagnosis not present

## 2015-12-05 DIAGNOSIS — R112 Nausea with vomiting, unspecified: Secondary | ICD-10-CM | POA: Insufficient documentation

## 2015-12-05 DIAGNOSIS — K565 Intestinal adhesions [bands] with obstruction (postprocedural) (postinfection): Secondary | ICD-10-CM | POA: Diagnosis present

## 2015-12-05 DIAGNOSIS — R1110 Vomiting, unspecified: Secondary | ICD-10-CM

## 2015-12-05 DIAGNOSIS — I481 Persistent atrial fibrillation: Secondary | ICD-10-CM | POA: Diagnosis not present

## 2015-12-05 DIAGNOSIS — E785 Hyperlipidemia, unspecified: Secondary | ICD-10-CM | POA: Diagnosis present

## 2015-12-05 DIAGNOSIS — R188 Other ascites: Secondary | ICD-10-CM | POA: Diagnosis present

## 2015-12-05 DIAGNOSIS — K5669 Other intestinal obstruction: Secondary | ICD-10-CM | POA: Diagnosis not present

## 2015-12-05 DIAGNOSIS — I482 Chronic atrial fibrillation: Secondary | ICD-10-CM | POA: Diagnosis not present

## 2015-12-05 DIAGNOSIS — J90 Pleural effusion, not elsewhere classified: Secondary | ICD-10-CM | POA: Diagnosis not present

## 2015-12-05 DIAGNOSIS — R0602 Shortness of breath: Secondary | ICD-10-CM | POA: Diagnosis present

## 2015-12-05 LAB — COMPREHENSIVE METABOLIC PANEL
ALBUMIN: 3.4 g/dL — AB (ref 3.5–5.0)
ALT: 16 U/L (ref 14–54)
AST: 23 U/L (ref 15–41)
Alkaline Phosphatase: 48 U/L (ref 38–126)
Anion gap: 12 (ref 5–15)
BUN: 12 mg/dL (ref 6–20)
CHLORIDE: 105 mmol/L (ref 101–111)
CO2: 24 mmol/L (ref 22–32)
CREATININE: 0.81 mg/dL (ref 0.44–1.00)
Calcium: 8.8 mg/dL — ABNORMAL LOW (ref 8.9–10.3)
GFR calc Af Amer: >60 mL/min (ref >60)
GLUCOSE: 112 mg/dL — AB (ref 65–99)
Potassium: 4.3 mmol/L (ref 3.5–5.1)
Sodium: 141 mmol/L (ref 135–145)
Total Bilirubin: 0.7 mg/dL (ref 0.3–1.2)
Total Protein: 5.4 g/dL — ABNORMAL LOW (ref 6.5–8.1)

## 2015-12-05 LAB — CBC
HEMATOCRIT: 40.6 % (ref 36.0–46.0)
Hemoglobin: 13.1 g/dL (ref 12.0–15.0)
MCH: 31 pg (ref 26.0–34.0)
MCHC: 32.3 g/dL (ref 30.0–36.0)
MCV: 96.2 fL (ref 78.0–100.0)
PLATELETS: 202 10*3/uL (ref 150–400)
RBC: 4.22 MIL/uL (ref 3.87–5.11)
RDW: 12.2 % (ref 11.5–15.5)
WBC: 8 10*3/uL (ref 4.0–10.5)

## 2015-12-05 LAB — TSH: TSH: 0.357 u[IU]/mL (ref 0.350–4.500)

## 2015-12-05 MED ORDER — CETYLPYRIDINIUM CHLORIDE 0.05 % MT LIQD
7.0000 mL | Freq: Two times a day (BID) | OROMUCOSAL | Status: DC
  Administered 2015-12-05 – 2015-12-15 (×19): 7 mL via OROMUCOSAL

## 2015-12-05 MED ORDER — ALBUTEROL SULFATE (2.5 MG/3ML) 0.083% IN NEBU: 2.5000 mg | INHALATION_SOLUTION | RESPIRATORY_TRACT | Status: DC | PRN

## 2015-12-05 MED ORDER — FAMOTIDINE IN NACL 20-0.9 MG/50ML-% IV SOLN
20.0000 mg | Freq: Two times a day (BID) | INTRAVENOUS | Status: DC
  Administered 2015-12-05 – 2015-12-14 (×19): 20 mg via INTRAVENOUS
  Filled 2015-12-05 (×22): qty 50

## 2015-12-05 MED ORDER — HYDROMORPHONE HCL 1 MG/ML IJ SOLN
1.0000 mg | INTRAMUSCULAR | Status: AC | PRN
  Administered 2015-12-05: 1 mg via INTRAVENOUS
  Filled 2015-12-05: qty 1

## 2015-12-05 MED ORDER — ONDANSETRON HCL 4 MG/2ML IJ SOLN
4.0000 mg | Freq: Four times a day (QID) | INTRAMUSCULAR | Status: DC | PRN
  Administered 2015-12-05 – 2015-12-08 (×4): 4 mg via INTRAVENOUS
  Filled 2015-12-05 (×4): qty 2

## 2015-12-05 MED ORDER — DIATRIZOATE MEGLUMINE & SODIUM 66-10 % PO SOLN
ORAL | Status: AC
  Filled 2015-12-05: qty 90

## 2015-12-05 MED ORDER — LEVOTHYROXINE SODIUM 100 MCG IV SOLR
37.5000 ug | Freq: Every day | INTRAVENOUS | Status: DC
  Administered 2015-12-05 – 2015-12-13 (×8): 37.5 ug via INTRAVENOUS
  Filled 2015-12-05 (×8): qty 5

## 2015-12-05 MED ORDER — DIATRIZOATE MEGLUMINE & SODIUM 66-10 % PO SOLN
90.0000 mL | Freq: Once | ORAL | Status: AC
  Administered 2015-12-05: 90 mL via NASOGASTRIC

## 2015-12-05 MED ORDER — ENOXAPARIN SODIUM 40 MG/0.4ML ~~LOC~~ SOLN
40.0000 mg | Freq: Every day | SUBCUTANEOUS | Status: DC
  Administered 2015-12-05 – 2015-12-08 (×4): 40 mg via SUBCUTANEOUS
  Filled 2015-12-05 (×5): qty 0.4

## 2015-12-05 MED ORDER — PROMETHAZINE HCL 25 MG/ML IJ SOLN: 12.5000 mg | Freq: Four times a day (QID) | INTRAMUSCULAR | Status: DC | PRN

## 2015-12-05 MED ORDER — SODIUM CHLORIDE 0.9 % IV SOLN
INTRAVENOUS | Status: DC
Start: 2015-12-05 — End: 2015-12-08
  Administered 2015-12-05 – 2015-12-08 (×7): via INTRAVENOUS

## 2015-12-05 MED ORDER — ONDANSETRON HCL 4 MG/2ML IJ SOLN
4.0000 mg | Freq: Three times a day (TID) | INTRAMUSCULAR | Status: DC | PRN
Start: 2015-12-05 — End: 2015-12-05

## 2015-12-05 MED ORDER — ALBUTEROL SULFATE (2.5 MG/3ML) 0.083% IN NEBU: 2.5000 mg | INHALATION_SOLUTION | Freq: Four times a day (QID) | RESPIRATORY_TRACT | Status: DC | PRN

## 2015-12-05 MED ORDER — ALBUTEROL SULFATE HFA 108 (90 BASE) MCG/ACT IN AERS: 2.0000 | INHALATION_SPRAY | Freq: Four times a day (QID) | RESPIRATORY_TRACT | Status: DC | PRN

## 2015-12-05 MED ORDER — MORPHINE SULFATE (PF) 4 MG/ML IV SOLN
4.0000 mg | Freq: Once | INTRAVENOUS | Status: AC
  Administered 2015-12-05: 4 mg via INTRAVENOUS
  Filled 2015-12-05: qty 1

## 2015-12-05 MED ORDER — ONDANSETRON HCL 4 MG PO TABS: 4.0000 mg | ORAL_TABLET | Freq: Four times a day (QID) | ORAL | Status: DC | PRN

## 2015-12-05 MED ORDER — MORPHINE SULFATE (PF) 4 MG/ML IV SOLN
4.0000 mg | INTRAVENOUS | Status: DC | PRN
  Administered 2015-12-06: 1 mg via INTRAVENOUS
  Administered 2015-12-07: 4 mg via INTRAVENOUS
  Filled 2015-12-05 (×2): qty 1

## 2015-12-05 NOTE — Progress Notes (Signed)
UR Completed. Galena Logie, RN, BSN.  [phone] 

## 2015-12-05 NOTE — ED Notes (Signed)
Attempted report 

## 2015-12-05 NOTE — Progress Notes (Addendum)
[facility] PROGRESS NOTE  Tracy [name] [id number] DOB: 11/26/1940 DOA: 12/04/2015 PCP: Aretta Nip, MD  Patient admitted after midnight  HPI/Brief narrative 75 year old female with a past medical history significant for hypothyroidism, mitral valve prolapse, status post appendectomy, and bilateral tubal ligation; who presents with complaints of of generalized abdominal pain with nausea and vomiting symptoms over the last day prior to admission. Pt was found to have SBO and was admitted for further work up.  Assessment/Plan: Small bowel obstruction: CT scan reveals early signs of a small bowel obstruction. Surgery consulted. - Admitted to a MedSurg bed - Cont NPO status - Continue nasogastric tube - cont IVF hydration as tolerated - IV Pepcid - Surgery following   Abdominal pain, nausea, and vomiting: Though most likely secondary to above. - PRN Dilaudid as needed for pain  Shortness of breath: - DuoNeb's as needed when necessary for shortness of breath  Hypothyroidism - TSH in normal range - continue Levothyroxine IV  Code Status: Full Family Communication: Pt in room Disposition Plan: Unclear at this time   Consultants:  General Surgery  Procedures:    Antibiotics: Anti-infectives    None      HPI/Subjective: Denies abd pain currently. Eager to have NG removed  Objective: Filed Vitals:   12/05/15 0100 12/05/15 0137 12/05/15 0527 12/05/15 1430  BP: 97/48 101/45 99/50 108/57  Pulse: 87 95 85 78  Temp:  97.9 F (36.6 C) 97.9 F (36.6 C) 97.9 F (36.6 C)  TempSrc:  Oral Oral Oral  Resp:  18 16 16   Height:  5\' 4"  (1.626 m)    Weight:  72.712 kg (160 lb 4.8 oz)    SpO2: 94% 98% 96% 97%    Intake/Output Summary (Last 24 hours) at 12/05/15 1542 Last data filed at 12/05/15 0904  Gross per 24 hour  Intake 238.33 ml  Output      0 ml  Net 238.33 ml   Filed Weights   12/04/15 2028 12/05/15 0137  Weight: 73.624 kg (162 lb 5 oz) 72.712  kg (160 lb 4.8 oz)    Exam:   General:  Awake, in nad  Cardiovascular: regular, s1, s2  Respiratory: normal resp effort, no wheezing  Abdomen: soft,nondistended  Musculoskeletal: perfused, no clubbing   Data Reviewed: Basic Metabolic Panel:  Recent Labs Lab 12/04/15 2030 12/05/15 0510  NA 141 141  K 3.7 4.3  CL 102 105  CO2 24 24  GLUCOSE 119* 112*  BUN 13 12  CREATININE 0.82 0.81  CALCIUM 9.3 8.8*   Liver Function Tests:  Recent Labs Lab 12/04/15 2030 12/05/15 0510  AST 28 23  ALT 19 16  ALKPHOS 55 48  BILITOT 0.9 0.7  PROT 6.6 5.4*  ALBUMIN 4.2 3.4*    Recent Labs Lab 12/04/15 2030  LIPASE 24   No results for input(s): AMMONIA in the last 168 hours. CBC:  Recent Labs Lab 12/04/15 2030 12/05/15 0510  WBC 8.2 8.0  HGB 13.7 13.1  HCT 41.8 40.6  MCV 96.1 96.2  PLT 208 202   Cardiac Enzymes: No results for input(s): CKTOTAL, CKMB, CKMBINDEX, TROPONINI in the last 168 hours. BNP (last 3 results) No results for input(s): BNP in the last 8760 hours.  ProBNP (last 3 results) No results for input(s): PROBNP in the last 8760 hours.  CBG: No results for input(s): GLUCAP in the last 168 hours.  No results found for this or any previous visit (from the past 240 hour(s)).  Studies: Ct Abdomen Pelvis W Contrast  12/04/2015  CLINICAL DATA:  75 year old female with lower abdominal pain and nausea. EXAM: CT ABDOMEN AND PELVIS WITH CONTRAST TECHNIQUE: Multidetector CT imaging of the abdomen and pelvis was performed using the standard protocol following bolus administration of intravenous contrast. CONTRAST:  177mL OMNIPAQUE IOHEXOL 300 MG/ML  SOLN COMPARISON:  CT dated 09/14/2008 FINDINGS: The visualized lung bases are clear. No intra-abdominal free air or free fluid. The liver, gallbladder, pancreas, spleen, adrenal glands appear unremarkable. Mild bilateral renal atrophy. There is no hydronephrosis on either side. The visualized ureters and urinary  bladder appear unremarkable. The uterus appear grossly unremarkable. Multiple top-normal caliber fluid-filled loops of small bowel noted in the lower abdomen and pelvis. These measure up to 2.4 cm in diameter. The distal and terminal ileum are collapsed. A focal area of narrowing in the small bowel in the right hemipelvis (series 201 image 56 and coronal series 203, image 57) noted likely related to underlying adhesions. There is a small probable metallic clip adjacent to this area likely from tubal ligation. Moderate stool noted throughout the colon. Appendectomy. The abdominal aorta and IVC appear unremarkable. No portal venous gas identified. There is no adenopathy. Small fat containing umbilical hernia. The abdominal wall soft tissues appear unremarkable. There is mild degenerative changes of spine. No acute fracture. IMPRESSION: Multiple top-normal caliber fluid-filled loops of small bowel in the mid and lower abdomen with transition zone in the right hemipelvis. Findings most consistent with an early small-bowel obstruction likely related to adhesions. Clinical correlation and follow-up recommended. Electronically Signed   By: Anner Crete M.D.   On: 12/04/2015 23:48   Dg Abd Portable 1v-small Bowel Protocol-position Verification  12/05/2015  CLINICAL DATA:  Encounter for nasogastric tube placement EXAM: PORTABLE ABDOMEN - 1 VIEW COMPARISON:  Portable exam 0955 hours compared to CT abdomen and pelvis 12/04/2015 FINDINGS: Nasogastric tube coiled in proximal stomach. Normal bowel gas pattern. Few air-filled nondistended loops of small bowel in mid abdomen. Excreted contrast material within renal collecting systems and bladder. No bowel dilatation or bowel wall thickening. Bones appear demineralized. IMPRESSION: Nasogastric tube coiled in proximal stomach. Electronically Signed   By: Lavonia Dana M.D.   On: 12/05/2015 10:23    Scheduled Meds: . antiseptic oral rinse  7 mL Mouth Rinse BID  . enoxaparin  (LOVENOX) injection  40 mg Subcutaneous Daily  . famotidine (PEPCID) IV  20 mg Intravenous Q12H  . levothyroxine  37.5 mcg Intravenous Daily   Continuous Infusions: . sodium chloride 100 mL/hr at 12/05/15 1457    Principal Problem:   Small bowel obstruction (HCC) Active Problems:   Hypothyroidism   Generalized abdominal pain   Nausea and vomiting   Imberly Troxler, [facility] Pager [phone]. If 7PM-7AM, please contact night-coverage at [email or web address], password [facility] 12/05/2015, 3:42 PM  LOS: 0 days

## 2015-12-05 NOTE — Consult Note (Addendum)
Reason for Consult: Small bowel obstruction Referring Physician: Dr. [name] is an 75 y.o. female.  HPI: A 75 year old female who comes in today secondary to nausea vomiting. Patient had approximately a 4-5 day history of this. Patient had some distention. Patient underwent workup which revealed small bowel obstruction on CT scan.  Patient does have a history of a previous appendectomy and tubal ligation via a low Pfannenstiel incision in the 70s. Gen. surgery was consulted workup and management.  Past Medical History  Diagnosis Date  . Mitral valve prolapse   . GERD (gastroesophageal reflux disease)   . Graves disease   . Hyperlipidemia   . Rosacea   . Hypothyroidism   . Colon polyps   . Duodenal adenoma   . LPRD (laryngopharyngeal reflux disease)     Past Surgical History  Procedure Laterality Date  . Cataract extraction, bilateral    . Ruptured disk    . Pilonidal cyst / sinus excision    . Tubal ligation    . Colonoscopy      x2  . Esophagogastroduodenoscopy      x2  . Appendectomy      Family History  Problem Relation Age of Onset  . Cancer Father     lung  . Cancer Mother     lung  . Cancer Paternal Grandfather     stomach  . Diabetes Paternal Grandmother   . Kidney failure Paternal Grandmother   . Heart attack Maternal Grandfather   . Cancer Brother     lung    Social History:  reports that she quit smoking about 33 years ago. Her smoking use included Cigarettes. She has a 22.5 pack-year smoking history. She has never used smokeless tobacco. She reports that she drinks alcohol. She reports that she does not use illicit drugs.  Allergies:  Allergies  Allergen Reactions  . Avelox [Moxifloxacin Hcl In Nacl] Other (See Comments)    Pt does not remember reaction  . Tussionex Pennkinetic Er [Hydrocod Polst-Cpm Polst Er] Itching and Nausea And Vomiting  . Codeine Itching, Nausea And Vomiting and Rash    ALL FORMS OF CODEINE  . Other Rash     Surgical Tape  . Penicillins Itching and Rash    ALL FORMS OF PENICILLINS Has patient had a PCN reaction causing immediate rash, facial/tongue/throat swelling, SOB or lightheadedness with hypotension: Yes Has patient had a PCN reaction causing severe rash involving mucus membranes or skin necrosis: No Has patient had a PCN reaction that required hospitalization No Has patient had a PCN reaction occurring within the last 10 years: No If all of the above answers are "NO", then may proceed with Cephalosporin use.    Medications: I have reviewed the patient's current medications.  Results for orders placed or performed during the hospital encounter of 12/04/15 (from the past 48 hour(s))  Lipase, blood     Status: None   Collection Time: 12/04/15  8:30 PM  Result Value Ref Range   Lipase 24 11 - 51 U/L  Comprehensive metabolic panel     Status: Abnormal   Collection Time: 12/04/15  8:30 PM  Result Value Ref Range   Sodium 141 135 - 145 mmol/L   Potassium 3.7 3.5 - 5.1 mmol/L   Chloride 102 101 - 111 mmol/L   CO2 24 22 - 32 mmol/L   Glucose, Bld 119 (H) 65 - 99 mg/dL   BUN 13 6 - 20 mg/dL   Creatinine, Ser 0.82  0.44 - 1.00 mg/dL   Calcium 9.3 8.9 - 10.3 mg/dL   Total Protein 6.6 6.5 - 8.1 g/dL   Albumin 4.2 3.5 - 5.0 g/dL   AST 28 15 - 41 U/L   ALT 19 14 - 54 U/L   Alkaline Phosphatase 55 38 - 126 U/L   Total Bilirubin 0.9 0.3 - 1.2 mg/dL   GFR calc non Af Amer >60 >60 mL/min   GFR calc Af Amer >60 >60 mL/min    Comment: (NOTE) The eGFR has been calculated using the CKD EPI equation. This calculation has not been validated in all clinical situations. eGFR's persistently <60 mL/min signify possible Chronic Kidney Disease.    Anion gap 15 5 - 15  CBC     Status: None   Collection Time: 12/04/15  8:30 PM  Result Value Ref Range   WBC 8.2 4.0 - 10.5 K/uL   RBC 4.35 3.87 - 5.11 MIL/uL   Hemoglobin 13.7 12.0 - 15.0 g/dL   HCT 41.8 36.0 - 46.0 %   MCV 96.1 78.0 - 100.0 fL   MCH  31.5 26.0 - 34.0 pg   MCHC 32.8 30.0 - 36.0 g/dL   RDW 12.1 11.5 - 15.5 %   Platelets 208 150 - 400 K/uL  Urinalysis, Routine w reflex microscopic (not at [facility])     Status: Abnormal   Collection Time: 12/04/15  8:38 PM  Result Value Ref Range   Color, Urine YELLOW YELLOW   APPearance CLOUDY (A) CLEAR   Specific Gravity, Urine 1.019 1.005 - 1.030   pH 5.0 5.0 - 8.0   Glucose, UA NEGATIVE NEGATIVE mg/dL   Hgb urine dipstick MODERATE (A) NEGATIVE   Bilirubin Urine SMALL (A) NEGATIVE   Ketones, ur 40 (A) NEGATIVE mg/dL   Protein, ur NEGATIVE NEGATIVE mg/dL   Nitrite NEGATIVE NEGATIVE   Leukocytes, UA NEGATIVE NEGATIVE  Urine microscopic-add on     Status: Abnormal   Collection Time: 12/04/15  8:38 PM  Result Value Ref Range   Squamous Epithelial / LPF 0-5 (A) NONE SEEN   WBC, UA 0-5 0 - 5 WBC/hpf   RBC / HPF 0-5 0 - 5 RBC/hpf   Bacteria, UA FEW (A) NONE SEEN   Crystals CA OXALATE CRYSTALS (A) NEGATIVE  CBC     Status: None   Collection Time: 12/05/15  5:10 AM  Result Value Ref Range   WBC 8.0 4.0 - 10.5 K/uL   RBC 4.22 3.87 - 5.11 MIL/uL   Hemoglobin 13.1 12.0 - 15.0 g/dL   HCT 40.6 36.0 - 46.0 %   MCV 96.2 78.0 - 100.0 fL   MCH 31.0 26.0 - 34.0 pg   MCHC 32.3 30.0 - 36.0 g/dL   RDW 12.2 11.5 - 15.5 %   Platelets 202 150 - 400 K/uL  Comprehensive metabolic panel     Status: Abnormal   Collection Time: 12/05/15  5:10 AM  Result Value Ref Range   Sodium 141 135 - 145 mmol/L   Potassium 4.3 3.5 - 5.1 mmol/L   Chloride 105 101 - 111 mmol/L   CO2 24 22 - 32 mmol/L   Glucose, Bld 112 (H) 65 - 99 mg/dL   BUN 12 6 - 20 mg/dL   Creatinine, Ser 0.81 0.44 - 1.00 mg/dL   Calcium 8.8 (L) 8.9 - 10.3 mg/dL   Total Protein 5.4 (L) 6.5 - 8.1 g/dL   Albumin 3.4 (L) 3.5 - 5.0 g/dL   AST 23 15 -  41 U/L   ALT 16 14 - 54 U/L   Alkaline Phosphatase 48 38 - 126 U/L   Total Bilirubin 0.7 0.3 - 1.2 mg/dL   GFR calc non Af Amer >60 >60 mL/min   GFR calc Af Amer >60 >60 mL/min    Comment:  (NOTE) The eGFR has been calculated using the CKD EPI equation. This calculation has not been validated in all clinical situations. eGFR's persistently <60 mL/min signify possible Chronic Kidney Disease.    Anion gap 12 5 - 15    Ct Abdomen Pelvis W Contrast  12/04/2015  CLINICAL DATA:  75 year old female with lower abdominal pain and nausea. EXAM: CT ABDOMEN AND PELVIS WITH CONTRAST TECHNIQUE: Multidetector CT imaging of the abdomen and pelvis was performed using the standard protocol following bolus administration of intravenous contrast. CONTRAST:  115m OMNIPAQUE IOHEXOL 300 MG/ML  SOLN COMPARISON:  CT dated 09/14/2008 FINDINGS: The visualized lung bases are clear. No intra-abdominal free air or free fluid. The liver, gallbladder, pancreas, spleen, adrenal glands appear unremarkable. Mild bilateral renal atrophy. There is no hydronephrosis on either side. The visualized ureters and urinary bladder appear unremarkable. The uterus appear grossly unremarkable. Multiple top-normal caliber fluid-filled loops of small bowel noted in the lower abdomen and pelvis. These measure up to 2.4 cm in diameter. The distal and terminal ileum are collapsed. A focal area of narrowing in the small bowel in the right hemipelvis (series 201 image 56 and coronal series 203, image 57) noted likely related to underlying adhesions. There is a small probable metallic clip adjacent to this area likely from tubal ligation. Moderate stool noted throughout the colon. Appendectomy. The abdominal aorta and IVC appear unremarkable. No portal venous gas identified. There is no adenopathy. Small fat containing umbilical hernia. The abdominal wall soft tissues appear unremarkable. There is mild degenerative changes of spine. No acute fracture. IMPRESSION: Multiple top-normal caliber fluid-filled loops of small bowel in the mid and lower abdomen with transition zone in the right hemipelvis. Findings most consistent with an early small-bowel  obstruction likely related to adhesions. Clinical correlation and follow-up recommended. Electronically Signed   By: AAnner CreteM.D.   On: 12/04/2015 23:48    Review of Systems  Constitutional: Negative.   HENT: Negative.   Respiratory: Negative.   Cardiovascular: Negative.   Gastrointestinal: Positive for vomiting and abdominal pain.  Musculoskeletal: Negative.   Neurological: Negative.    Blood pressure 99/50, pulse 85, temperature 97.9 F (36.6 C), temperature source Oral, resp. rate 16, height '5\' 4"'  (1.626 m), weight 72.712 kg (160 lb 4.8 oz), SpO2 96 %. Physical Exam  Constitutional: She is oriented to person, place, and time. She appears well-developed and well-nourished.  HENT:  Head: Normocephalic and atraumatic.  Eyes: Conjunctivae and EOM are normal. Pupils are equal, round, and reactive to light.  Neck: Normal range of motion. Neck supple.  Cardiovascular: Normal rate and normal heart sounds.   Respiratory: Effort normal and breath sounds normal.  GI: Soft. Bowel sounds are normal. She exhibits distension. There is tenderness.  Musculoskeletal: Normal range of motion.  Neurological: She is alert and oriented to person, place, and time.    Assessment/Plan: 75year old female with likely SBO.  1. We'll initiate small bowel protocol 2. We'll follow up.  RRosario Jacks, Tracy [name] 12/05/2015, 6:51 AM

## 2015-12-05 NOTE — Progress Notes (Signed)
S: pain much better, no nausea, +flatus in bathroom O: BP 99/50 mmHg  Pulse 85  Temp(Src) 97.9 F (36.6 C) (Oral)  Resp 16  Ht 5\' 4"  (1.626 m)  Wt 72.712 kg (160 lb 4.8 oz)  BMI 27.50 kg/m2  SpO2 96% NAD Ab: soft, NT, ND A: small bowel obstruction 2/2 adhesions -continue NGT with SB protocol -NPO -pain control

## 2015-12-05 NOTE — ED Notes (Signed)
Dr. White in room.

## 2015-12-05 NOTE — H&P (Addendum)
[facility] History and Physical  Tracy [name] [id number] DOB: [birth year] DOA: 12/04/2015  Referring physician: ED PCP: Tracy Nip, MD   Chief Complaint: abdominal pain with nausea vomiting  HPI: Tracy [name] is a 75 year old female with a past medical history significant for hypothyroidism, mitral valve prolapse, status post appendectomy, and bilateral tubal ligation; who presents with complaints of of generalized abdominal pain with nausea and vomiting symptoms over the last day. Abdominal pain symptoms started around 4 PM. She reported initial discomfort that progressively worsened to the point where she started having nausea and vomiting.Vomitus is yellow in color. Patient has not been able to try anything to relieve symptoms at this point. Associated symptoms include constipation over the last few weeks. Last bowel movement was this morning, but notes that it was small amount of normal consistency.  Denies having any fever, chills, chest pain, diarrhea, blood in stool. Upon admission patient was evaluated with a CT of the abdomen was seen have signs of early small bowel obstruction.    Review of Systems  Constitutional: Positive for malaise/fatigue. Negative for fever and chills.  HENT: Negative for ear pain and tinnitus.   Eyes: Negative for double vision and photophobia.  Respiratory: Positive for shortness of breath. Negative for hemoptysis.   Cardiovascular: Negative for chest pain and leg swelling.  Gastrointestinal: Positive for nausea, vomiting and abdominal pain.  Genitourinary: Negative for urgency and frequency.  Musculoskeletal: Positive for joint pain. Negative for neck pain.  Skin: Negative for itching and rash.  Neurological: Negative for speech change and focal weakness.  Endo/Heme/Allergies: Negative for environmental allergies and polydipsia.  Psychiatric/Behavioral: Negative for suicidal ideas and substance abuse.      Past Medical History   Diagnosis Date  . Mitral valve prolapse   . GERD (gastroesophageal reflux disease)   . Graves disease   . Hyperlipidemia   . Rosacea   . Hypothyroidism   . Colon polyps   . Duodenal adenoma   . LPRD (laryngopharyngeal reflux disease)      Past Surgical History  Procedure Laterality Date  . Cataract extraction, bilateral    . Ruptured disk    . Pilonidal cyst / sinus excision    . Tubal ligation    . Colonoscopy      x2  . Esophagogastroduodenoscopy      x2  . Appendectomy        Social History:  reports that she quit smoking about 33 years ago. Her smoking use included Cigarettes. She has a 22.5 pack-year smoking history. She has never used smokeless tobacco. She reports that she drinks alcohol. She reports that she does not use illicit drugs.   Allergies  Allergen Reactions  . Avelox [Moxifloxacin Hcl In Nacl] Other (See Comments)    Pt does not remember reaction  . Tussionex Pennkinetic Er [Hydrocod Polst-Cpm Polst Er] Itching and Nausea And Vomiting  . Codeine Itching, Nausea And Vomiting and Rash    ALL FORMS OF CODEINE  . Other Rash    Surgical Tape  . Penicillins Itching and Rash    ALL FORMS OF PENICILLINS Has patient had a PCN reaction causing immediate rash, facial/tongue/throat swelling, SOB or lightheadedness with hypotension: Yes Has patient had a PCN reaction causing severe rash involving mucus membranes or skin necrosis: No Has patient had a PCN reaction that required hospitalization No Has patient had a PCN reaction occurring within the last 10 years: No If all of the above answers are "NO", then may  proceed with Cephalosporin use.    Family History  Problem Relation Age of Onset  . Cancer Father     lung  . Cancer Mother     lung  . Cancer Paternal Grandfather     stomach  . Diabetes Paternal Grandmother   . Kidney failure Paternal Grandmother   . Heart attack Maternal Grandfather   . Cancer Brother     lung      FAMILY HISTORY  When  questioned  Directly-patient reports  No family history of HTN, CVA ,DIABETES, TB, Cancer CAD, Bleeding Disorders, Sickle Cell, diabetes, anemia, asthma,   Prior to Admission medications   Medication Sig Start Date End Date Taking? Authorizing Provider  acetaminophen (TYLENOL) 500 MG tablet Take 1,000 mg by mouth every 6 (six) hours as needed (pain).   Yes Historical Provider, MD  albuterol (PROVENTIL HFA;VENTOLIN HFA) 108 (90 BASE) MCG/ACT inhaler Inhale 2 puffs into the lungs every 6 (six) hours as needed for wheezing or shortness of breath.    Yes Historical Provider, MD  Calcium Carbonate-Vitamin D (CALCIUM-VITAMIN D) 600-200 MG-UNIT CAPS Take 1 tablet by mouth 2 (two) times daily.    Yes Historical Provider, MD  ezetimibe-simvastatin (VYTORIN) 10-40 MG per tablet Take 1 tablet by mouth at bedtime.    Yes Historical Provider, MD  levothyroxine (SYNTHROID, LEVOTHROID) 75 MCG tablet Take 75 mcg by mouth daily.   Yes Historical Provider, MD  montelukast (SINGULAIR) 10 MG tablet Take 10 mg by mouth at bedtime as needed (seasonal allergies).  11/19/15  Yes Historical Provider, MD  Multiple Vitamin (MULTIVITAMIN WITH MINERALS) TABS tablet Take 1 tablet by mouth daily.   Yes Historical Provider, MD  ranitidine (ZANTAC) 300 MG tablet Take 300 mg by mouth 2 (two) times daily as needed for heartburn (acid reflux).    Yes Historical Provider, MD  fluticasone (FLONASE) 50 MCG/ACT nasal spray Place 2 sprays into the nose 2 (two) times daily. Patient not taking: Reported on 12/04/2015 01/30/13   Collene Gobble, MD  QC LORATADINE ALLERGY RELIEF 10 MG tablet TAKE 1 TABLET (10 MG TOTAL) BY MOUTH DAILY. Patient not taking: Reported on 12/04/2015 08/04/13   Collene Gobble, MD  Spacer/Aero-Holding Chambers (AEROCHAMBER MV) inhaler Use as instructed 05/30/13   Collene Gobble, MD     Physical Exam: Filed Vitals:   12/04/15 2230 12/04/15 2245 12/05/15 0000 12/05/15 0030  BP: 125/65 124/53 108/57 121/81  Pulse: 63 63 91  84  Temp:      TempSrc:      Resp:      Height:      Weight:      SpO2: 99% 99% 97% 98%    Constitutional: Vital signs reviewed. Patient is a sick appearing, but not toxic.  Head: Normocephalic and atraumatic  Ear: TM normal bilaterally  Mouth: no erythema or exudates, MMM  Eyes: PERRL, EOMI, conjunctivae normal, No scleral icterus.  Neck: Supple, Trachea midline normal ROM, No JVD, mass, thyromegaly, or carotid bruit present.  Cardiovascular: RRR, S1 normal, S2 normal, no MRG, pulses symmetric and intact bilaterally  Pulmonary/Chest: CTAB, no wheezes, rales, or rhonchi  Abdominal: Soft. Generalized tenderness, bowel sound decreased in 4 quadrants  GU: no CVA tenderness Musculoskeletal: No joint deformities, erythema, or stiffness, ROM full and no nontender Ext: no edema and no cyanosis, pulses palpable bilaterally (DP and PT)  Hematology: no cervical, inginal, or axillary adenopathy.  Neurological: A&O x3, Strenght is normal and symmetric bilaterally, cranial nerve II-XII are  grossly intact, no focal motor deficit, sensory intact to light touch bilaterally.  Skin: Warm, dry and intact. No rash, cyanosis, or clubbing.  Psychiatric: Normal mood and affect. speech and behavior is normal. Judgment and thought content normal. Cognition and memory are normal.      Data Review   Micro Results No results found for this or any previous visit (from the past 240 hour(s)).  Radiology Reports Ct Abdomen Pelvis W Contrast  12/04/2015  CLINICAL DATA:  74 year old female with lower abdominal pain and nausea. EXAM: CT ABDOMEN AND PELVIS WITH CONTRAST TECHNIQUE: Multidetector CT imaging of the abdomen and pelvis was performed using the standard protocol following bolus administration of intravenous contrast. CONTRAST:  178mL OMNIPAQUE IOHEXOL 300 MG/ML  SOLN COMPARISON:  CT dated 09/14/2008 FINDINGS: The visualized lung bases are clear. No intra-abdominal free air or free fluid. The liver,  gallbladder, pancreas, spleen, adrenal glands appear unremarkable. Mild bilateral renal atrophy. There is no hydronephrosis on either side. The visualized ureters and urinary bladder appear unremarkable. The uterus appear grossly unremarkable. Multiple top-normal caliber fluid-filled loops of small bowel noted in the lower abdomen and pelvis. These measure up to 2.4 cm in diameter. The distal and terminal ileum are collapsed. A focal area of narrowing in the small bowel in the right hemipelvis (series 201 image 56 and coronal series 203, image 57) noted likely related to underlying adhesions. There is a small probable metallic clip adjacent to this area likely from tubal ligation. Moderate stool noted throughout the colon. Appendectomy. The abdominal aorta and IVC appear unremarkable. No portal venous gas identified. There is no adenopathy. Small fat containing umbilical hernia. The abdominal wall soft tissues appear unremarkable. There is mild degenerative changes of spine. No acute fracture. IMPRESSION: Multiple top-normal caliber fluid-filled loops of small bowel in the mid and lower abdomen with transition zone in the right hemipelvis. Findings most consistent with an early small-bowel obstruction likely related to adhesions. Clinical correlation and follow-up recommended. Electronically Signed   By: Anner Crete M.D.   On: 12/04/2015 23:48     CBC  Recent Labs Lab 12/04/15 2030  WBC 8.2  HGB 13.7  HCT 41.8  PLT 208  MCV 96.1  MCH 31.5  MCHC 32.8  RDW 12.1    Chemistries   Recent Labs Lab 12/04/15 2030  NA 141  K 3.7  CL 102  CO2 24  GLUCOSE 119*  BUN 13  CREATININE 0.82  CALCIUM 9.3  AST 28  ALT 19  ALKPHOS 55  BILITOT 0.9   ------------------------------------------------------------------------------------------------------------------ estimated creatinine clearance is 59.2 mL/min (by C-G formula based on Cr of  0.82). ------------------------------------------------------------------------------------------------------------------ No results for input(s): HGBA1C in the last 72 hours. ------------------------------------------------------------------------------------------------------------------ No results for input(s): CHOL, HDL, LDLCALC, TRIG, CHOLHDL, LDLDIRECT in the last 72 hours. ------------------------------------------------------------------------------------------------------------------ No results for input(s): TSH, T4TOTAL, T3FREE, THYROIDAB in the last 72 hours.  Invalid input(s): FREET3 ------------------------------------------------------------------------------------------------------------------ No results for input(s): VITAMINB12, FOLATE, FERRITIN, TIBC, IRON, RETICCTPCT in the last 72 hours.  Coagulation profile No results for input(s): INR, PROTIME in the last 168 hours.  No results for input(s): DDIMER in the last 72 hours.  Cardiac Enzymes No results for input(s): CKMB, TROPONINI, MYOGLOBIN in the last 168 hours.  Invalid input(s): CK ------------------------------------------------------------------------------------------------------------------ Invalid input(s): POCBNP   CBG: No results for input(s): GLUCAP in the last 168 hours.       Assessment/Plan Small bowel obstruction: acute.patient was abdominal pain complaints with nausea and vomiting. Physical exam reveals tenderness to palpation  generalized across abdomen. CT scan reveals early signs of a small bowel obstruction. Surgery consulted. Cause of small bowel obstruction thought to be possibly constipation. - Admit to a MedSurg bed - Pt to be NPO - Continue nasogastric tube - normal saline IV fluids at 100 ml/hour - IV Pepcid - check EKG - Follow-up surgery recommendations   Abdominal pain, nausea, and vomiting: acute. Though most likely secondary to above. -  When necessary Dilaudid as needed for  pain  Shortness of breath: chronic. Patient's vitals are stable O2 sats maintained greater than 92% on room air. - DuoNeb's as needed when necessary for shortness of breath  Hypothyroidism - check TSH as hypothyroidism uncontrolled can cause constipation - changed Levothyroxine from po to IV   Code Status:   full Family Communication: bedside Disposition Plan: admit   Total time spent 55 minutes.Greater than 50% of this time was spent in counseling, explanation of diagnosis, planning of further management, and coordination of care  [facility] Pager [phone]  If 7PM-7AM, please contact night-coverage [email or web address] Password [email or web address] 12/05/2015, 12:45 AM

## 2015-12-06 ENCOUNTER — Inpatient Hospital Stay (HOSPITAL_COMMUNITY): Payer: Medicare Other

## 2015-12-06 DIAGNOSIS — R112 Nausea with vomiting, unspecified: Secondary | ICD-10-CM

## 2015-12-06 LAB — BASIC METABOLIC PANEL
Anion gap: 11 (ref 5–15)
BUN: 15 mg/dL (ref 6–20)
CALCIUM: 8.7 mg/dL — AB (ref 8.9–10.3)
CHLORIDE: 110 mmol/L (ref 101–111)
CO2: 23 mmol/L (ref 22–32)
CREATININE: 0.9 mg/dL (ref 0.44–1.00)
GFR calc non Af Amer: >60 mL/min (ref >60)
GLUCOSE: 83 mg/dL (ref 65–99)
Potassium: 4.1 mmol/L (ref 3.5–5.1)
Sodium: 144 mmol/L (ref 135–145)

## 2015-12-06 LAB — CBC
HEMATOCRIT: 44.6 % (ref 36.0–46.0)
HEMOGLOBIN: 14.5 g/dL (ref 12.0–15.0)
MCH: 31.6 pg (ref 26.0–34.0)
MCHC: 32.5 g/dL (ref 30.0–36.0)
MCV: 97.2 fL (ref 78.0–100.0)
Platelets: 202 10*3/uL (ref 150–400)
RBC: 4.59 MIL/uL (ref 3.87–5.11)
RDW: 12.5 % (ref 11.5–15.5)
WBC: 9.3 10*3/uL (ref 4.0–10.5)

## 2015-12-06 MED ORDER — FLEET ENEMA 7-19 GM/118ML RE ENEM
1.0000 | ENEMA | Freq: Once | RECTAL | Status: AC
  Administered 2015-12-06: 1 via RECTAL
  Filled 2015-12-06: qty 1

## 2015-12-06 MED ORDER — WHITE PETROLATUM GEL
Status: AC
  Administered 2015-12-06: 10:00:00
  Filled 2015-12-06: qty 1

## 2015-12-06 NOTE — Progress Notes (Signed)
[facility] PROGRESS NOTE  Tracy [name] [id number] DOB: 04-Sep-1941 DOA: 12/04/2015 PCP: Aretta Nip, MD  HPI/Brief narrative 75 year old female with a past medical history significant for hypothyroidism, mitral valve prolapse, status post appendectomy, and bilateral tubal ligation; who presents with complaints of of generalized abdominal pain with nausea and vomiting symptoms over the last day prior to admission. Pt was found to have SBO and was admitted for further work up.  Assessment/Plan: Small bowel obstruction: CT scan reveals early signs of a small bowel obstruction. Surgery was consulted. - Cont NPO status - Continue nasogastric tube - cont IVF hydration as tolerated - IV Pepcid - Bowel sounds on exam this AM although pt denies flatus or BM - Encourage ambulation as tolerated - Xray this AM with unchanged dilated loops of small bowel  Abdominal pain, nausea, and vomiting: Though most likely secondary to above. - PRN Dilaudid as needed for pain  Shortness of breath: - DuoNeb's as needed when necessary for shortness of breath  Hypothyroidism - TSH in normal range - continue Levothyroxine IV  Code Status: Full Family Communication: Pt in room Disposition Plan: Unclear at this time   Consultants:  General Surgery  Procedures:    Antibiotics: Anti-infectives    None      HPI/Subjective: Pt w/o abd pain, flatus or BM  Objective: Filed Vitals:   12/05/15 1430 12/05/15 2159 12/06/15 0515 12/06/15 1311  BP: 108/57 98/42 119/62 124/65  Pulse: 78 70 81 78  Temp: 97.9 F (36.6 C) 99 F (37.2 C) 97.3 F (36.3 C) 98.3 F (36.8 C)  TempSrc: Oral Oral Oral Oral  Resp: 16 17 18 18   Height:      Weight:      SpO2: 97% 98% 97% 99%    Intake/Output Summary (Last 24 hours) at 12/06/15 1651 Last data filed at 12/06/15 1443  Gross per 24 hour  Intake 1933.33 ml  Output    900 ml  Net 1033.33 ml   Filed Weights   12/04/15 2028 12/05/15 0137   Weight: 73.624 kg (162 lb 5 oz) 72.712 kg (160 lb 4.8 oz)    Exam:   General:  Awake, in nad, laying in bed  Cardiovascular: regular, s1, s2  Respiratory: normal resp effort, no wheezing  Abdomen: soft,nondistended, pos BS  Musculoskeletal: perfused, no clubbing, no cyanosis   Data Reviewed: Basic Metabolic Panel:  Recent Labs Lab 12/04/15 2030 12/05/15 0510 12/06/15 0652  NA 141 141 144  K 3.7 4.3 4.1  CL 102 105 110  CO2 24 24 23   GLUCOSE 119* 112* 83  BUN 13 12 15   CREATININE 0.82 0.81 0.90  CALCIUM 9.3 8.8* 8.7*   Liver Function Tests:  Recent Labs Lab 12/04/15 2030 12/05/15 0510  AST 28 23  ALT 19 16  ALKPHOS 55 48  BILITOT 0.9 0.7  PROT 6.6 5.4*  ALBUMIN 4.2 3.4*    Recent Labs Lab 12/04/15 2030  LIPASE 24   No results for input(s): AMMONIA in the last 168 hours. CBC:  Recent Labs Lab 12/04/15 2030 12/05/15 0510 12/06/15 0652  WBC 8.2 8.0 9.3  HGB 13.7 13.1 14.5  HCT 41.8 40.6 44.6  MCV 96.1 96.2 97.2  PLT 208 202 202   Cardiac Enzymes: No results for input(s): CKTOTAL, CKMB, CKMBINDEX, TROPONINI in the last 168 hours. BNP (last 3 results) No results for input(s): BNP in the last 8760 hours.  ProBNP (last 3 results) No results for input(s): PROBNP in the last 8760  hours.  CBG: No results for input(s): GLUCAP in the last 168 hours.  No results found for this or any previous visit (from the past 240 hour(s)).   Studies: Ct Abdomen Pelvis W Contrast  12/04/2015  CLINICAL DATA:  75 year old female with lower abdominal pain and nausea. EXAM: CT ABDOMEN AND PELVIS WITH CONTRAST TECHNIQUE: Multidetector CT imaging of the abdomen and pelvis was performed using the standard protocol following bolus administration of intravenous contrast. CONTRAST:  142mL OMNIPAQUE IOHEXOL 300 MG/ML  SOLN COMPARISON:  CT dated 09/14/2008 FINDINGS: The visualized lung bases are clear. No intra-abdominal free air or free fluid. The liver, gallbladder,  pancreas, spleen, adrenal glands appear unremarkable. Mild bilateral renal atrophy. There is no hydronephrosis on either side. The visualized ureters and urinary bladder appear unremarkable. The uterus appear grossly unremarkable. Multiple top-normal caliber fluid-filled loops of small bowel noted in the lower abdomen and pelvis. These measure up to 2.4 cm in diameter. The distal and terminal ileum are collapsed. A focal area of narrowing in the small bowel in the right hemipelvis (series 201 image 56 and coronal series 203, image 57) noted likely related to underlying adhesions. There is a small probable metallic clip adjacent to this area likely from tubal ligation. Moderate stool noted throughout the colon. Appendectomy. The abdominal aorta and IVC appear unremarkable. No portal venous gas identified. There is no adenopathy. Small fat containing umbilical hernia. The abdominal wall soft tissues appear unremarkable. There is mild degenerative changes of spine. No acute fracture. IMPRESSION: Multiple top-normal caliber fluid-filled loops of small bowel in the mid and lower abdomen with transition zone in the right hemipelvis. Findings most consistent with an early small-bowel obstruction likely related to adhesions. Clinical correlation and follow-up recommended. Electronically Signed   By: Anner Crete M.D.   On: 12/04/2015 23:48   Dg Abd 2 Views  12/06/2015  CLINICAL DATA:  Abdominal pain, mild small bowel dilatation on recent CT examination. EXAM: ABDOMEN - 2 VIEW COMPARISON:  12/05/2015 FINDINGS: Nasogastric catheter is noted within the distal stomach. Few scattered loops of mildly dilated small bowel are noted. No free air is seen. No new obstructive changes are noted. IMPRESSION: Few loops of mildly dilated small bowel not significantly changed from previous exam. Electronically Signed   By: Inez Catalina M.D.   On: 12/06/2015 07:40   Dg Abd Portable 1v-small Bowel Obstruction Protocol-initial, 8 Hr  Delay  12/05/2015  CLINICAL DATA:  Followup small bowel obstruction. EXAM: PORTABLE ABDOMEN - 1 VIEW COMPARISON:  12/05/2015 FINDINGS: Nasogastric tube is again seen with tip in proximal stomach. Oral contrast remains within the stomach. Multiple mildly dilated small bowel loops are again seen in the mid and lower abdomen, without significant change. No significant distal progression of oral contrast material is seen. The The colon remains nondilated. These findings are suspicious for partial small bowel obstruction. IMPRESSION: Findings suspicious for partial small bowel obstruction. Electronically Signed   By: Earle Gell M.D.   On: 12/05/2015 21:05   Dg Abd Portable 1v-small Bowel Protocol-position Verification  12/05/2015  CLINICAL DATA:  Encounter for nasogastric tube placement EXAM: PORTABLE ABDOMEN - 1 VIEW COMPARISON:  Portable exam 0955 hours compared to CT abdomen and pelvis 12/04/2015 FINDINGS: Nasogastric tube coiled in proximal stomach. Normal bowel gas pattern. Few air-filled nondistended loops of small bowel in mid abdomen. Excreted contrast material within renal collecting systems and bladder. No bowel dilatation or bowel wall thickening. Bones appear demineralized. IMPRESSION: Nasogastric tube coiled in proximal stomach. Electronically  Signed   By: Lavonia Dana M.D.   On: 12/05/2015 10:23    Scheduled Meds: . antiseptic oral rinse  7 mL Mouth Rinse BID  . enoxaparin (LOVENOX) injection  40 mg Subcutaneous Daily  . famotidine (PEPCID) IV  20 mg Intravenous Q12H  . levothyroxine  37.5 mcg Intravenous Daily   Continuous Infusions: . sodium chloride 100 mL/hr at 12/06/15 1443    Principal Problem:   Small bowel obstruction (HCC) Active Problems:   Hypothyroidism   Generalized abdominal pain   Nausea and vomiting   [facility], [facility] Pager [phone]. If 7PM-7AM, please contact night-coverage at [email or web address], password [email or web address] 12/06/2015, 4:51 PM  LOS: 1 day

## 2015-12-06 NOTE — Progress Notes (Signed)
Subjective: Stable and alert.  Very pleasant. Has passed a little flatus but denies stool. States her pain has resolved. History appendectomy and bilateral tubal ligation through Pfannenstiel incision many years ago.  No other abdominal surgery Denies history of prior SBO. Single view abdominal film yesterday shows one loop of small bowel, somewhat suspicious, nothing high-grade  NG output reported 300 mL.  Thin green color.  Plan to check labs in 2 view abdominal x-ray this morning  Objective: Vital signs in last 24 hours: Temp:  [97.3 F (36.3 C)-99 F (37.2 C)] 97.3 F (36.3 C) (03/06 0515) Pulse Rate:  [70-81] 81 (03/06 0515) Resp:  [16-18] 18 (03/06 0515) BP: (98-119)/(42-62) 119/62 mmHg (03/06 0515) SpO2:  [97 %-98 %] 97 % (03/06 0515) Last BM Date: 12/04/15  Intake/Output from previous day: 03/05 0701 - 03/06 0700 In: 1200 [P.O.:60; I.V.:1140] Out: 300 [Emesis/NG output:300] Intake/Output this shift: Total I/O In: 1200 [P.O.:60; I.V.:1140] Out: 300 [Emesis/NG output:300]    EXAM: General appearance: Alert.  Pleasant.  Mental status normal.  No distress whatsoever. Resp: clear to auscultation bilaterally GI: Abdomen soft.  Nontender.  No hernias or masses.  Normal active BS  Lab Results:   Recent Labs  12/04/15 2030 12/05/15 0510  WBC 8.2 8.0  HGB 13.7 13.1  HCT 41.8 40.6  PLT 208 202   BMET  Recent Labs  12/04/15 2030 12/05/15 0510  NA 141 141  K 3.7 4.3  CL 102 105  CO2 24 24  GLUCOSE 119* 112*  BUN 13 12  CREATININE 0.82 0.81  CALCIUM 9.3 8.8*   PT/INR No results for input(s): LABPROT, INR in the last 72 hours. ABG No results for input(s): PHART, HCO3 in the last 72 hours.  Invalid input(s): PCO2, PO2  Studies/Results: Ct Abdomen Pelvis W Contrast  12/04/2015  CLINICAL DATA:  75 year old female with lower abdominal pain and nausea. EXAM: CT ABDOMEN AND PELVIS WITH CONTRAST TECHNIQUE: Multidetector CT imaging of the abdomen and  pelvis was performed using the standard protocol following bolus administration of intravenous contrast. CONTRAST:  164mL OMNIPAQUE IOHEXOL 300 MG/ML  SOLN COMPARISON:  CT dated 09/14/2008 FINDINGS: The visualized lung bases are clear. No intra-abdominal free air or free fluid. The liver, gallbladder, pancreas, spleen, adrenal glands appear unremarkable. Mild bilateral renal atrophy. There is no hydronephrosis on either side. The visualized ureters and urinary bladder appear unremarkable. The uterus appear grossly unremarkable. Multiple top-normal caliber fluid-filled loops of small bowel noted in the lower abdomen and pelvis. These measure up to 2.4 cm in diameter. The distal and terminal ileum are collapsed. A focal area of narrowing in the small bowel in the right hemipelvis (series 201 image 56 and coronal series 203, image 57) noted likely related to underlying adhesions. There is a small probable metallic clip adjacent to this area likely from tubal ligation. Moderate stool noted throughout the colon. Appendectomy. The abdominal aorta and IVC appear unremarkable. No portal venous gas identified. There is no adenopathy. Small fat containing umbilical hernia. The abdominal wall soft tissues appear unremarkable. There is mild degenerative changes of spine. No acute fracture. IMPRESSION: Multiple top-normal caliber fluid-filled loops of small bowel in the mid and lower abdomen with transition zone in the right hemipelvis. Findings most consistent with an early small-bowel obstruction likely related to adhesions. Clinical correlation and follow-up recommended. Electronically Signed   By: Anner Crete M.D.   On: 12/04/2015 23:48   Dg Abd Portable 1v-small Bowel Obstruction Protocol-initial, 8 Hr Delay  12/05/2015  CLINICAL DATA:  Followup small bowel obstruction. EXAM: PORTABLE ABDOMEN - 1 VIEW COMPARISON:  12/05/2015 FINDINGS: Nasogastric tube is again seen with tip in proximal stomach. Oral contrast remains  within the stomach. Multiple mildly dilated small bowel loops are again seen in the mid and lower abdomen, without significant change. No significant distal progression of oral contrast material is seen. The The colon remains nondilated. These findings are suspicious for partial small bowel obstruction. IMPRESSION: Findings suspicious for partial small bowel obstruction. Electronically Signed   By: Earle Gell M.D.   On: 12/05/2015 21:05   Dg Abd Portable 1v-small Bowel Protocol-position Verification  12/05/2015  CLINICAL DATA:  Encounter for nasogastric tube placement EXAM: PORTABLE ABDOMEN - 1 VIEW COMPARISON:  Portable exam 0955 hours compared to CT abdomen and pelvis 12/04/2015 FINDINGS: Nasogastric tube coiled in proximal stomach. Normal bowel gas pattern. Few air-filled nondistended loops of small bowel in mid abdomen. Excreted contrast material within renal collecting systems and bladder. No bowel dilatation or bowel wall thickening. Bones appear demineralized. IMPRESSION: Nasogastric tube coiled in proximal stomach. Electronically Signed   By: Lavonia Dana M.D.   On: 12/05/2015 10:23    Anti-infectives: Anti-infectives    None      Assessment/Plan:   SBO secondary to adhesions.  First episode. No evidence of bowel compromise.  Hopefully this will resolve without surgical intervention but has not had a stool yet Ambulate. Check two-view abdominal x-ray and labs today  History appendectomy and tubal ligation, single operation, many years ago  Hypothyroidism. TSH reportedly normal on levothyroxine IV History treatment for Graves' disease Dr. Forde Dandy.   LOS: 1 day    Adin Hector 12/06/2015

## 2015-12-07 ENCOUNTER — Inpatient Hospital Stay (HOSPITAL_COMMUNITY): Payer: Medicare Other

## 2015-12-07 LAB — CBC
HEMATOCRIT: 40.8 % (ref 36.0–46.0)
HEMOGLOBIN: 13.7 g/dL (ref 12.0–15.0)
MCH: 32.8 pg (ref 26.0–34.0)
MCHC: 33.6 g/dL (ref 30.0–36.0)
MCV: 97.6 fL (ref 78.0–100.0)
Platelets: 179 10*3/uL (ref 150–400)
RBC: 4.18 MIL/uL (ref 3.87–5.11)
RDW: 12.6 % (ref 11.5–15.5)
WBC: 9.1 10*3/uL (ref 4.0–10.5)

## 2015-12-07 LAB — BASIC METABOLIC PANEL
ANION GAP: 13 (ref 5–15)
BUN: 18 mg/dL (ref 6–20)
CHLORIDE: 110 mmol/L (ref 101–111)
CO2: 25 mmol/L (ref 22–32)
CREATININE: 0.82 mg/dL (ref 0.44–1.00)
Calcium: 8.6 mg/dL — ABNORMAL LOW (ref 8.9–10.3)
GFR calc non Af Amer: >60 mL/min (ref >60)
Glucose, Bld: 88 mg/dL (ref 65–99)
Potassium: 3.8 mmol/L (ref 3.5–5.1)
Sodium: 148 mmol/L — ABNORMAL HIGH (ref 135–145)

## 2015-12-07 MED ORDER — SORBITOL 70 % SOLN
960.0000 mL | TOPICAL_OIL | Freq: Once | ORAL | Status: DC
  Filled 2015-12-07: qty 240

## 2015-12-07 MED ORDER — FLEET ENEMA 7-19 GM/118ML RE ENEM
1.0000 | ENEMA | Freq: Once | RECTAL | Status: AC
  Administered 2015-12-07: 1 via RECTAL
  Filled 2015-12-07: qty 1

## 2015-12-07 NOTE — Care Management Important Message (Signed)
Important Message  Patient Details  Name: Tracy [name] MRN: [id number] Date of Birth: [birth year]   Medicare Important Message Given:  Yes    Loann Quill 12/07/2015, 8:45 AM

## 2015-12-07 NOTE — Progress Notes (Signed)
Subjective: Denies pain or cramps.  Says she is hungry No stool.  Maybe a little flatus Said she vomited a little while down in x-ray yesterday  Labs normal X-rays today again show indeterminate pattern of ileus versus mild SBO. CT on March 4 suggested transition zone in right pelvis.  Objective: Vital signs in last 24 hours: Temp:  [97.5 F (36.4 C)-98.3 F (36.8 C)] 97.5 F (36.4 C) 03-Jan-2023 0600) Pulse Rate:  [78-93] 83 January 03, 2023 0600) Resp:  [16-18] 16 01-03-23 0600) BP: (108-124)/(49-65) 108/61 mmHg January 03, 2023 0600) SpO2:  [96 %-99 %] 96 % 2023/01/03 0600) Last BM Date: 12/06/15  Intake/Output from previous day: 03/06 0701 - January 03, 2023 0700 In: 1933.3 [P.O.:60; I.V.:1823.3; IV Piggyback:50] Out: 2150 [Urine:1150; Emesis/NG output:1000] Intake/Output this shift: Total I/O In: 0  Out: 300 [Urine:300]  General appearance: Very pleasant.  No distress.  Mental status normal GI: Soft.  Nontender.  Doesn't seem distended.  Hypoactive bowel sounds.  No hernia or mass.  Lab Results:   Recent Labs  12/06/15 0652 03-Jan-2016 0705  WBC 9.3 9.1  HGB 14.5 13.7  HCT 44.6 40.8  PLT 202 179   BMET  Recent Labs  12/06/15 0652 January 03, 2016 0705  NA 144 148*  K 4.1 3.8  CL 110 110  CO2 23 25  GLUCOSE 83 88  BUN 15 18  CREATININE 0.90 0.82  CALCIUM 8.7* 8.6*   PT/INR No results for input(s): LABPROT, INR in the last 72 hours. ABG No results for input(s): PHART, HCO3 in the last 72 hours.  Invalid input(s): PCO2, PO2  Studies/Results: Dg Abd 2 Views  01/03/16  CLINICAL DATA:  75 year old female with nausea and vomiting with abdominal pain for 3 days. Small bowel obstruction suspected on recent CT Abdomen and Pelvis. Initial encounter. EXAM: ABDOMEN - 2 VIEW COMPARISON:  12/06/2015 and earlier. FINDINGS: Upright and supine views of the abdomen. Enteric tube remains in place, side hole the level of the gastric body. Minimally changed appearance of multiple mid abdominal air in fluid  containing small bowel loops. Fluid levels persist on upright images. Paucity of colonic gas, but the large bowel appears largely decompressed as seen on the recent CT. Overall, the gas pattern has not significantly improved since 12/04/2015. No pneumoperitoneum. Stable lung bases. No acute osseous abnormality identified. IMPRESSION: 1. Enteric tube remains in place, side hole to level of the gastric body. 2. No significant improvement in bowel gas pattern since 12/04/2015, suggesting small bowel obstruction or small bowel ileus. 3. No free air. Electronically Signed   By: Genevie Ann M.D.   On: 01/03/16 09:02   Dg Abd 2 Views  12/06/2015  CLINICAL DATA:  Abdominal pain, mild small bowel dilatation on recent CT examination. EXAM: ABDOMEN - 2 VIEW COMPARISON:  12/05/2015 FINDINGS: Nasogastric catheter is noted within the distal stomach. Few scattered loops of mildly dilated small bowel are noted. No free air is seen. No new obstructive changes are noted. IMPRESSION: Few loops of mildly dilated small bowel not significantly changed from previous exam. Electronically Signed   By: Inez Catalina M.D.   On: 12/06/2015 07:40   Dg Abd Portable 1v-small Bowel Obstruction Protocol-initial, 8 Hr Delay  12/05/2015  CLINICAL DATA:  Followup small bowel obstruction. EXAM: PORTABLE ABDOMEN - 1 VIEW COMPARISON:  12/05/2015 FINDINGS: Nasogastric tube is again seen with tip in proximal stomach. Oral contrast remains within the stomach. Multiple mildly dilated small bowel loops are again seen in the mid and lower abdomen, without significant change. No  significant distal progression of oral contrast material is seen. The The colon remains nondilated. These findings are suspicious for partial small bowel obstruction. IMPRESSION: Findings suspicious for partial small bowel obstruction. Electronically Signed   By: Earle Gell M.D.   On: 12/05/2015 21:05    Anti-infectives: Anti-infectives    None      Assessment/Plan:   Low-grade partial SBO question resolving Clamp NG tube and allow clear liquid diet. Advance diet if remains asymptomatic. Consider laparoscopy or laparotomy if symptoms recur    LOS: 2 days    Tracy [name] M 12/07/2015

## 2015-12-07 NOTE — Progress Notes (Signed)
Pt a/o, no c/o pain, pt given fleets enema with little outcome, pt requested another enema as she feels " I am not fully empty yet" MD paged and ordered SMOG enema, pt remains on IVF NS @ 100 ml/hr, NG tube clamped and flushed Q shift as ordered, VSS, pt stable

## 2015-12-07 NOTE — Progress Notes (Signed)
[facility] PROGRESS NOTE  Tracy [name] [id number] DOB: [birth year] DOA: 12/04/2015 PCP: Aretta Nip, MD  HPI/Brief narrative 75 year old female with a past medical history significant for hypothyroidism, mitral valve prolapse, status post appendectomy, and bilateral tubal ligation; who presents with complaints of of generalized abdominal pain with nausea and vomiting symptoms over the last day prior to admission. Pt was found to have SBO and was admitted for further work up.  Assessment/Plan: Small bowel obstruction: CT scan reveals early signs of a small bowel obstruction. Surgery was consulted. - Cont NPO status - Continue nasogastric tube - cont IVF hydration as tolerated - IV Pepcid - Bowel sounds on exam this AM although pt denies flatus. Small BM with fleets enema x 1 yesterday - Encourage ambulation as tolerated - Minimal results with repeat fleets enema. Will give SMOG  Abdominal pain, nausea, and vomiting: Though most likely secondary to above. - PRN Dilaudid as needed for pain  Shortness of breath: - DuoNeb's as needed when necessary for shortness of breath  Hypothyroidism - TSH in normal range - continue Levothyroxine IV  Code Status: Full Family Communication: Pt in room Disposition Plan: Possible d/c in 48-72hrs   Consultants:  General Surgery  Procedures:    Antibiotics: Anti-infectives    None      HPI/Subjective: Passing small stool with fleets enema overnight. Still feels "full"  Objective: Filed Vitals:   12/06/15 1311 12/06/15 2121 12/07/15 0600 12/07/15 1356  BP: 124/65 115/49 108/61 126/62  Pulse: 78 93 83 91  Temp: 98.3 F (36.8 C)  97.5 F (36.4 C) 97.9 F (36.6 C)  TempSrc: Oral   Oral  Resp: 18 17 16 16   Height:      Weight:      SpO2: 99% 98% 96% 100%    Intake/Output Summary (Last 24 hours) at 12/07/15 1657 Last data filed at 12/07/15 1357  Gross per 24 hour  Intake   1740 ml  Output   2051 ml  Net    -311 ml   Filed Weights   12/04/15 2028 12/05/15 0137  Weight: 73.624 kg (162 lb 5 oz) 72.712 kg (160 lb 4.8 oz)    Exam:   General:  Awake, in nad, laying in bed  Cardiovascular: regular, s1, s2  Respiratory: normal resp effort, no wheezing  Abdomen: soft,nondistended, pos BS  Musculoskeletal: perfused, no clubbing   Data Reviewed: Basic Metabolic Panel:  Recent Labs Lab 12/04/15 2030 12/05/15 0510 12/06/15 0652 12/07/15 0705  NA 141 141 144 148*  K 3.7 4.3 4.1 3.8  CL 102 105 110 110  CO2 24 24 23 25   GLUCOSE 119* 112* 83 88  BUN 13 12 15 18   CREATININE 0.82 0.81 0.90 0.82  CALCIUM 9.3 8.8* 8.7* 8.6*   Liver Function Tests:  Recent Labs Lab 12/04/15 2030 12/05/15 0510  AST 28 23  ALT 19 16  ALKPHOS 55 48  BILITOT 0.9 0.7  PROT 6.6 5.4*  ALBUMIN 4.2 3.4*    Recent Labs Lab 12/04/15 2030  LIPASE 24   No results for input(s): AMMONIA in the last 168 hours. CBC:  Recent Labs Lab 12/04/15 2030 12/05/15 0510 12/06/15 0652 12/07/15 0705  WBC 8.2 8.0 9.3 9.1  HGB 13.7 13.1 14.5 13.7  HCT 41.8 40.6 44.6 40.8  MCV 96.1 96.2 97.2 97.6  PLT 208 202 202 179   Cardiac Enzymes: No results for input(s): CKTOTAL, CKMB, CKMBINDEX, TROPONINI in the last 168 hours. BNP (last 3 results) No  results for input(s): BNP in the last 8760 hours.  ProBNP (last 3 results) No results for input(s): PROBNP in the last 8760 hours.  CBG: No results for input(s): GLUCAP in the last 168 hours.  No results found for this or any previous visit (from the past 240 hour(s)).   Studies: Dg Abd 2 Views  12/07/2015  CLINICAL DATA:  75 year old female with nausea and vomiting with abdominal pain for 3 days. Small bowel obstruction suspected on recent CT Abdomen and Pelvis. Initial encounter. EXAM: ABDOMEN - 2 VIEW COMPARISON:  12/06/2015 and earlier. FINDINGS: Upright and supine views of the abdomen. Enteric tube remains in place, side hole the level of the gastric body.  Minimally changed appearance of multiple mid abdominal air in fluid containing small bowel loops. Fluid levels persist on upright images. Paucity of colonic gas, but the large bowel appears largely decompressed as seen on the recent CT. Overall, the gas pattern has not significantly improved since 12/04/2015. No pneumoperitoneum. Stable lung bases. No acute osseous abnormality identified. IMPRESSION: 1. Enteric tube remains in place, side hole to level of the gastric body. 2. No significant improvement in bowel gas pattern since 12/04/2015, suggesting small bowel obstruction or small bowel ileus. 3. No free air. Electronically Signed   By: Genevie Ann M.D.   On: 12/07/2015 09:02   Dg Abd 2 Views  12/06/2015  CLINICAL DATA:  Abdominal pain, mild small bowel dilatation on recent CT examination. EXAM: ABDOMEN - 2 VIEW COMPARISON:  12/05/2015 FINDINGS: Nasogastric catheter is noted within the distal stomach. Few scattered loops of mildly dilated small bowel are noted. No free air is seen. No new obstructive changes are noted. IMPRESSION: Few loops of mildly dilated small bowel not significantly changed from previous exam. Electronically Signed   By: Inez Catalina M.D.   On: 12/06/2015 07:40   Dg Abd Portable 1v-small Bowel Obstruction Protocol-initial, 8 Hr Delay  12/05/2015  CLINICAL DATA:  Followup small bowel obstruction. EXAM: PORTABLE ABDOMEN - 1 VIEW COMPARISON:  12/05/2015 FINDINGS: Nasogastric tube is again seen with tip in proximal stomach. Oral contrast remains within the stomach. Multiple mildly dilated small bowel loops are again seen in the mid and lower abdomen, without significant change. No significant distal progression of oral contrast material is seen. The The colon remains nondilated. These findings are suspicious for partial small bowel obstruction. IMPRESSION: Findings suspicious for partial small bowel obstruction. Electronically Signed   By: Earle Gell M.D.   On: 12/05/2015 21:05    Scheduled  Meds: . antiseptic oral rinse  7 mL Mouth Rinse BID  . enoxaparin (LOVENOX) injection  40 mg Subcutaneous Daily  . famotidine (PEPCID) IV  20 mg Intravenous Q12H  . levothyroxine  37.5 mcg Intravenous Daily  . sorbitol, milk of mag, mineral oil, glycerin (SMOG) enema  960 mL Rectal Once   Continuous Infusions: . sodium chloride 100 mL/hr at 12/07/15 0307    Principal Problem:   Small bowel obstruction (HCC) Active Problems:   Hypothyroidism   Generalized abdominal pain   Nausea and vomiting   Nicolina Hirt, [facility] Pager [phone]. If 7PM-7AM, please contact night-coverage at [email or web address], password [facility] 12/07/2015, 4:57 PM  LOS: 2 days

## 2015-12-07 NOTE — Progress Notes (Signed)
Subjective: Feels better.  Denies abdominal pain or cramps Had a solid stool within the last hour Family and friends here Denies nausea and says she is hungry  Abdominal x-rays continue to show a couple of subtle loops of small bowel, not distended.  Hard to tell whether this is ileus or partial SBO.  Some gas and stool in colon.  WBC 9100.  Potassium 3.8.  Calcium 8.6.  Objective: Vital signs in last 24 hours: Temp:  [97.5 F (36.4 C)-98.3 F (36.8 C)] 97.5 F (36.4 C) 08-Dec-2022 0600) Pulse Rate:  [78-93] 83 2022/12/08 0600) Resp:  [16-18] 16 12/08/22 0600) BP: (108-124)/(49-65) 108/61 mmHg 12/08/2022 0600) SpO2:  [96 %-99 %] 96 % December 08, 2022 0600) Last BM Date: 12/06/15  Intake/Output from previous day: 03/06 0701 - 2022/12/08 0700 In: 1933.3 [P.O.:60; I.V.:1823.3; IV Piggyback:50] Out: 2150 [Urine:1150; Emesis/NG output:1000] Intake/Output this shift: Total I/O In: 0  Out: 300 [Urine:300]    EXAM: General appearance: Alert.  Sitting up in bed.  No distress.  Oriented. Resp: clear to auscultation bilaterally GI: Soft.  Nontender.  Not distended.  Hypoactive bowel sounds.  No hernias.  No mass.  Lab Results:   Recent Labs  12/06/15 0652 December 08, 2015 0705  WBC 9.3 9.1  HGB 14.5 13.7  HCT 44.6 40.8  PLT 202 179   BMET  Recent Labs  12/06/15 0652 Dec 08, 2015 0705  NA 144 148*  K 4.1 3.8  CL 110 110  CO2 23 25  GLUCOSE 83 88  BUN 15 18  CREATININE 0.90 0.82  CALCIUM 8.7* 8.6*   PT/INR No results for input(s): LABPROT, INR in the last 72 hours. ABG No results for input(s): PHART, HCO3 in the last 72 hours.  Invalid input(s): PCO2, PO2  Studies/Results: Dg Abd 2 Views  December 08, 2015  CLINICAL DATA:  75 year old female with nausea and vomiting with abdominal pain for 3 days. Small bowel obstruction suspected on recent CT Abdomen and Pelvis. Initial encounter. EXAM: ABDOMEN - 2 VIEW COMPARISON:  12/06/2015 and earlier. FINDINGS: Upright and supine views of the abdomen. Enteric  tube remains in place, side hole the level of the gastric body. Minimally changed appearance of multiple mid abdominal air in fluid containing small bowel loops. Fluid levels persist on upright images. Paucity of colonic gas, but the large bowel appears largely decompressed as seen on the recent CT. Overall, the gas pattern has not significantly improved since 12/04/2015. No pneumoperitoneum. Stable lung bases. No acute osseous abnormality identified. IMPRESSION: 1. Enteric tube remains in place, side hole to level of the gastric body. 2. No significant improvement in bowel gas pattern since 12/04/2015, suggesting small bowel obstruction or small bowel ileus. 3. No free air. Electronically Signed   By: Genevie Ann M.D.   On: 12-08-15 09:02   Dg Abd 2 Views  12/06/2015  CLINICAL DATA:  Abdominal pain, mild small bowel dilatation on recent CT examination. EXAM: ABDOMEN - 2 VIEW COMPARISON:  12/05/2015 FINDINGS: Nasogastric catheter is noted within the distal stomach. Few scattered loops of mildly dilated small bowel are noted. No free air is seen. No new obstructive changes are noted. IMPRESSION: Few loops of mildly dilated small bowel not significantly changed from previous exam. Electronically Signed   By: Inez Catalina M.D.   On: 12/06/2015 07:40   Dg Abd Portable 1v-small Bowel Obstruction Protocol-initial, 8 Hr Delay  12/05/2015  CLINICAL DATA:  Followup small bowel obstruction. EXAM: PORTABLE ABDOMEN - 1 VIEW COMPARISON:  12/05/2015 FINDINGS: Nasogastric tube is again seen  with tip in proximal stomach. Oral contrast remains within the stomach. Multiple mildly dilated small bowel loops are again seen in the mid and lower abdomen, without significant change. No significant distal progression of oral contrast material is seen. The The colon remains nondilated. These findings are suspicious for partial small bowel obstruction. IMPRESSION: Findings suspicious for partial small bowel obstruction. Electronically  Signed   By: Earle Gell M.D.   On: 12/05/2015 21:05    Anti-infectives: Anti-infectives    None      Assessment/Plan:   SBO secondary to adhesions. First episode.  X-rays are inconclusive No evidence of bowel compromise. Hopefully this will resolve without surgical intervention but has not had a stool yet We will clamp the NG tube and allow clear liquid diet. If tolerates well remove NG tube tomorrow and advance diet If she develops recurrent obstructive symptoms, then I think I laparoscopic lysis of adhesions, possible open lysis of adhesions will become necessary.  History appendectomy and tubal ligation, single operation, many years ago  Hypothyroidism. TSH reportedly normal on levothyroxine IV History treatment for Graves' disease Dr. Forde Dandy.   LOS: 2 days    Clydine Parkison M 12/07/2015

## 2015-12-08 ENCOUNTER — Inpatient Hospital Stay (HOSPITAL_COMMUNITY): Payer: Medicare Other

## 2015-12-08 LAB — BASIC METABOLIC PANEL
Anion gap: 15 (ref 5–15)
BUN: 14 mg/dL (ref 6–20)
CO2: 22 mmol/L (ref 22–32)
CREATININE: 0.86 mg/dL (ref 0.44–1.00)
Calcium: 8.6 mg/dL — ABNORMAL LOW (ref 8.9–10.3)
Chloride: 107 mmol/L (ref 101–111)
GFR calc Af Amer: >60 mL/min (ref >60)
Glucose, Bld: 87 mg/dL (ref 65–99)
POTASSIUM: 3.4 mmol/L — AB (ref 3.5–5.1)
SODIUM: 144 mmol/L (ref 135–145)

## 2015-12-08 LAB — CBC
HCT: 40.4 % (ref 36.0–46.0)
Hemoglobin: 13.4 g/dL (ref 12.0–15.0)
MCH: 32 pg (ref 26.0–34.0)
MCHC: 33.2 g/dL (ref 30.0–36.0)
MCV: 96.4 fL (ref 78.0–100.0)
PLATELETS: 161 10*3/uL (ref 150–400)
RBC: 4.19 MIL/uL (ref 3.87–5.11)
RDW: 12.5 % (ref 11.5–15.5)
WBC: 9.6 10*3/uL (ref 4.0–10.5)

## 2015-12-08 MED ORDER — KCL IN DEXTROSE-NACL 20-5-0.9 MEQ/L-%-% IV SOLN
INTRAVENOUS | Status: DC
  Administered 2015-12-08: 19:00:00 via INTRAVENOUS
  Filled 2015-12-08 (×3): qty 1000

## 2015-12-08 MED ORDER — POTASSIUM CHLORIDE 10 MEQ/100ML IV SOLN
10.0000 meq | INTRAVENOUS | Status: AC
  Administered 2015-12-08 (×2): 10 meq via INTRAVENOUS
  Filled 2015-12-08 (×2): qty 100

## 2015-12-08 NOTE — Progress Notes (Signed)
Patient ID: Tracy [name], female   DOB: 20-May-1941, 75 y.o.   MRN: [id number]     [facility]      [address] [place], [place] [address]    Phone: [phone] FAX: [phone]     Subjective: Nausea and vomiting.  NGT back to suction 1178m out, dark bilious.  No increase in pain.   Objective:  Vital signs:  Filed Vitals:   12/07/15 0600 12/07/15 1356 12/07/15 2216 12/08/15 0625  BP: 108/61 126/62 121/66 126/64  Pulse: 83 91 92 76  Temp: 97.5 F (36.4 C) 97.9 F (36.6 C) 99 F (37.2 C) 97.9 F (36.6 C)  TempSrc:  Oral Oral Oral  Resp: _0 Height:      Weight:      SpO2: 96% 100% 93% 99%    Last BM Date: 12/04/15  Intake/Output   Yesterday:  03/07 0701 - 03/08 0700 In: 2990 [P.O.:540; I.V.:2400; IV Piggyback:50] Out: 2651 [Urine:1500; Emesis/NG output:1150; Stool:1] This shift:     Physical Exam: General: Pt awake/alert/oriented x4 in no acute distress  Abdomen: Soft.  Distended, but tender.  No evidence of peritonitis.  No incarcerated hernias.    Problem List:   Principal Problem:   Small bowel obstruction (HCC) Active Problems:   Hypothyroidism   Generalized abdominal pain   Nausea and vomiting    Results:   Labs: Results for orders placed or performed during the hospital encounter of 12/04/15 (from the past 48 hour(s))  CBC     Status: None   Collection Time: 12/07/15  7:05 AM  Result Value Ref Range   WBC 9.1 4.0 - 10.5 K/uL   RBC 4.18 3.87 - 5.11 MIL/uL   Hemoglobin 13.7 12.0 - 15.0 g/dL   HCT 40.8 36.0 - 46.0 %   MCV 97.6 78.0 - 100.0 fL   MCH 32.8 26.0 - 34.0 pg   MCHC 33.6 30.0 - 36.0 g/dL   RDW 12.6 11.5 - 15.5 %   Platelets 179 150 - 400 K/uL  Basic metabolic panel     Status: Abnormal   Collection Time: 12/07/15  7:05 AM  Result Value Ref Range   Sodium 148 (H) 135 - 145 mmol/L   Potassium 3.8 3.5 - 5.1 mmol/L   Chloride 110 101 - 111 mmol/L   CO2 25 22 - 32 mmol/L    Glucose, Bld 88 65 - 99 mg/dL   BUN 18 6 - 20 mg/dL   Creatinine, Ser 0.82 0.44 - 1.00 mg/dL   Calcium 8.6 (L) 8.9 - 10.3 mg/dL   GFR calc non Af Amer >60 >60 mL/min   GFR calc Af Amer >60 >60 mL/min    Comment: (NOTE) The eGFR has been calculated using the CKD EPI equation. This calculation has not been validated in all clinical situations. eGFR's persistently <60 mL/min signify possible Chronic Kidney Disease.    Anion gap 13 5 - 15    Imaging / Studies: Dg Abd 2 Views  12/07/2015  CLINICAL DATA:  75year old female with nausea and vomiting with abdominal pain for 3 days. Small bowel obstruction suspected on recent CT Abdomen and Pelvis. Initial encounter. EXAM: ABDOMEN - 2 VIEW COMPARISON:  12/06/2015 and earlier. FINDINGS: Upright and supine views of the abdomen. Enteric tube remains in place, side hole the level of the gastric body. Minimally changed appearance of multiple mid abdominal air in fluid containing small bowel loops. Fluid levels persist on  upright images. Paucity of colonic gas, but the large bowel appears largely decompressed as seen on the recent CT. Overall, the gas pattern has not significantly improved since 12/04/2015. No pneumoperitoneum. Stable lung bases. No acute osseous abnormality identified. IMPRESSION: 1. Enteric tube remains in place, side hole to level of the gastric body. 2. No significant improvement in bowel gas pattern since 12/04/2015, suggesting small bowel obstruction or small bowel ileus. 3. No free air. Electronically Signed   By: Genevie Ann M.D.   On: 12/07/2015 09:02    Medications / Allergies:  Scheduled Meds: . antiseptic oral rinse  7 mL Mouth Rinse BID  . enoxaparin (LOVENOX) injection  40 mg Subcutaneous Daily  . famotidine (PEPCID) IV  20 mg Intravenous Q12H  . levothyroxine  37.5 mcg Intravenous Daily  . sorbitol, milk of mag, mineral oil, glycerin (SMOG) enema  960 mL Rectal Once   Continuous Infusions: . sodium chloride 100 mL/hr at  12/08/15 0919   PRN Meds:.albuterol, morphine injection, ondansetron **OR** ondansetron (ZOFRAN) IV, promethazine  Antibiotics: Anti-infectives    None        Assessment/Plan Hx appendectomy and tubal ligation  SBO-likely secondary to adhesions.  Nausea and vomiting overnight, NGT back to suction.  Will place back to NPO, NGT to LIWS, obtain AXR, CBC and a BMP.  Tentatively plan for laparoscopic lysis of adhesions, possible open surgery. FEN-NPO, IVF VTE prophylaxis-SCD/lovenox Dispo-continue inpatient   Erby Pian, [facility] Pager [phone]) For consults and floor pages call [phone])  12/08/2015 10:41 AM

## 2015-12-08 NOTE — Care Management Note (Signed)
Case Management Note  Patient Details  Name: ZARI STEFFENSMEIER MRN: [id number] Date of Birth: 07-05-1941  Subjective/Objective:                    Action/Plan:   Expected Discharge Date:  12/08/15               Expected Discharge Plan:  Home/Self Care  In-House Referral:     Discharge planning Services     Post Acute Care Choice:    Choice offered to:     DME Arranged:    DME Agency:     HH Arranged:    [place] Agency:     Status of Service:  In process, will continue to follow  Medicare Important Message Given:  Yes Date Medicare IM Given:    Medicare IM give by:    Date Additional Medicare IM Given:    Additional Medicare Important Message give by:     If discussed at [place] of Stay Meetings, dates discussed:    Additional Comments: UR updated  Marilu Favre, RN 12/08/2015, 10:35 AM

## 2015-12-08 NOTE — Progress Notes (Signed)
75 year old female past history of hypothyroidism who presented with complaints of generalized abdominal pain on 3/4 and found to have small bowel obstruction. Patient initially managed conservatively and to be improving, however overnight worsened. Seen by general surgery who plans to take patient to operating room tomorrow.

## 2015-12-09 ENCOUNTER — Inpatient Hospital Stay (HOSPITAL_COMMUNITY): Payer: Medicare Other | Admitting: Certified Registered Nurse Anesthetist

## 2015-12-09 ENCOUNTER — Encounter (HOSPITAL_COMMUNITY): Admission: EM | Disposition: A | Discharge status: Home / Self Care | Payer: Self-pay | Source: Home / Self Care

## 2015-12-09 DIAGNOSIS — K56609 Unspecified intestinal obstruction, unspecified as to partial versus complete obstruction: Secondary | ICD-10-CM | POA: Insufficient documentation

## 2015-12-09 DIAGNOSIS — I482 Chronic atrial fibrillation: Secondary | ICD-10-CM

## 2015-12-09 DIAGNOSIS — K5669 Other intestinal obstruction: Secondary | ICD-10-CM

## 2015-12-09 DIAGNOSIS — I4891 Unspecified atrial fibrillation: Secondary | ICD-10-CM | POA: Diagnosis not present

## 2015-12-09 HISTORY — PX: LAPAROTOMY: SHX154

## 2015-12-09 LAB — CBC
HCT: 43.9 % (ref 36.0–46.0)
Hemoglobin: 14.8 g/dL (ref 12.0–15.0)
MCH: 32.3 pg (ref 26.0–34.0)
MCHC: 33.7 g/dL (ref 30.0–36.0)
MCV: 95.9 fL (ref 78.0–100.0)
Platelets: 197 10*3/uL (ref 150–400)
RBC: 4.58 MIL/uL (ref 3.87–5.11)
RDW: 12.4 % (ref 11.5–15.5)
WBC: 11.4 10*3/uL — AB (ref 4.0–10.5)

## 2015-12-09 LAB — SURGICAL PCR SCREEN
MRSA, PCR: NEGATIVE
STAPHYLOCOCCUS AUREUS: NEGATIVE

## 2015-12-09 LAB — BASIC METABOLIC PANEL
Anion gap: 11 (ref 5–15)
BUN: 10 mg/dL (ref 6–20)
CALCIUM: 8.4 mg/dL — AB (ref 8.9–10.3)
CO2: 27 mmol/L (ref 22–32)
CREATININE: 0.78 mg/dL (ref 0.44–1.00)
Chloride: 106 mmol/L (ref 101–111)
GFR calc non Af Amer: >60 mL/min (ref >60)
Glucose, Bld: 111 mg/dL — ABNORMAL HIGH (ref 65–99)
Potassium: 3 mmol/L — ABNORMAL LOW (ref 3.5–5.1)
SODIUM: 144 mmol/L (ref 135–145)

## 2015-12-09 LAB — CREATININE, SERUM
Creatinine, Ser: 0.76 mg/dL (ref 0.44–1.00)
GFR calc non Af Amer: >60 mL/min (ref >60)

## 2015-12-09 SURGERY — LAPAROTOMY, EXPLORATORY
Anesthesia: General | Site: Abdomen

## 2015-12-09 MED ORDER — ENOXAPARIN SODIUM 40 MG/0.4ML ~~LOC~~ SOLN
40.0000 mg | SUBCUTANEOUS | Status: DC
  Administered 2015-12-10: 40 mg via SUBCUTANEOUS
  Filled 2015-12-09: qty 0.4

## 2015-12-09 MED ORDER — SUCCINYLCHOLINE CHLORIDE 20 MG/ML IJ SOLN
INTRAMUSCULAR | Status: DC | PRN
  Administered 2015-12-09: 80 mg via INTRAVENOUS

## 2015-12-09 MED ORDER — CEFAZOLIN SODIUM-DEXTROSE 2-3 GM-% IV SOLR
INTRAVENOUS | Status: DC | PRN
  Administered 2015-12-09: 2 g via INTRAVENOUS

## 2015-12-09 MED ORDER — DEXTROSE 5 % IV SOLN
5.0000 mg/h | INTRAVENOUS | Status: AC
Start: 2015-12-09 — End: 2015-12-10
  Filled 2015-12-09: qty 100

## 2015-12-09 MED ORDER — FENTANYL CITRATE (PF) 250 MCG/5ML IJ SOLN
INTRAMUSCULAR | Status: AC
  Filled 2015-12-09: qty 5

## 2015-12-09 MED ORDER — ROCURONIUM BROMIDE 100 MG/10ML IV SOLN
INTRAVENOUS | Status: DC | PRN
  Administered 2015-12-09: 30 mg via INTRAVENOUS
  Administered 2015-12-09: 10 mg via INTRAVENOUS

## 2015-12-09 MED ORDER — PROPOFOL 10 MG/ML IV BOLUS
INTRAVENOUS | Status: DC | PRN
  Administered 2015-12-09: 150 mg via INTRAVENOUS

## 2015-12-09 MED ORDER — LIDOCAINE HCL (CARDIAC) 20 MG/ML IV SOLN
INTRAVENOUS | Status: AC
  Filled 2015-12-09: qty 5

## 2015-12-09 MED ORDER — ACETAMINOPHEN 10 MG/ML IV SOLN
1000.0000 mg | Freq: Four times a day (QID) | INTRAVENOUS | Status: AC
  Administered 2015-12-10 (×3): 1000 mg via INTRAVENOUS
  Filled 2015-12-09 (×4): qty 100

## 2015-12-09 MED ORDER — DEXTROSE 5 % IV SOLN
10.0000 mg | INTRAVENOUS | Status: DC | PRN
  Administered 2015-12-09: 20 ug/min via INTRAVENOUS

## 2015-12-09 MED ORDER — 0.9 % SODIUM CHLORIDE (POUR BTL) OPTIME
TOPICAL | Status: DC | PRN
  Administered 2015-12-09: 1000 mL

## 2015-12-09 MED ORDER — ONDANSETRON 4 MG PO TBDP: 4.0000 mg | ORAL_TABLET | Freq: Four times a day (QID) | ORAL | Status: DC | PRN

## 2015-12-09 MED ORDER — LACTATED RINGERS IV SOLN: INTRAVENOUS | Status: DC

## 2015-12-09 MED ORDER — LACTATED RINGERS IV SOLN
INTRAVENOUS | Status: DC | PRN
  Administered 2015-12-09: 10:00:00 via INTRAVENOUS

## 2015-12-09 MED ORDER — ONDANSETRON HCL 4 MG/2ML IJ SOLN
INTRAMUSCULAR | Status: DC | PRN
  Administered 2015-12-09: 4 mg via INTRAVENOUS

## 2015-12-09 MED ORDER — BUPIVACAINE-EPINEPHRINE (PF) 0.25% -1:200000 IJ SOLN
INTRAMUSCULAR | Status: AC
Start: 2015-12-09 — End: 2015-12-09
  Filled 2015-12-09: qty 30

## 2015-12-09 MED ORDER — SUGAMMADEX SODIUM 200 MG/2ML IV SOLN
INTRAVENOUS | Status: AC
  Filled 2015-12-09: qty 2

## 2015-12-09 MED ORDER — ONDANSETRON HCL 4 MG/2ML IJ SOLN
4.0000 mg | Freq: Four times a day (QID) | INTRAMUSCULAR | Status: DC | PRN
  Administered 2015-12-09 – 2015-12-12 (×5): 4 mg via INTRAVENOUS
  Filled 2015-12-09 (×5): qty 2

## 2015-12-09 MED ORDER — MORPHINE SULFATE (PF) 2 MG/ML IV SOLN
1.0000 mg | INTRAVENOUS | Status: DC | PRN
  Administered 2015-12-09 (×2): 1 mg via INTRAVENOUS
  Filled 2015-12-09 (×2): qty 1

## 2015-12-09 MED ORDER — BUPIVACAINE-EPINEPHRINE 0.25% -1:200000 IJ SOLN
INTRAMUSCULAR | Status: DC | PRN
  Administered 2015-12-09: 3 mL

## 2015-12-09 MED ORDER — SODIUM CHLORIDE 0.9 % IJ SOLN
INTRAMUSCULAR | Status: AC
  Filled 2015-12-09: qty 10

## 2015-12-09 MED ORDER — EPHEDRINE SULFATE 50 MG/ML IJ SOLN
INTRAMUSCULAR | Status: AC
  Filled 2015-12-09: qty 1

## 2015-12-09 MED ORDER — HYDROMORPHONE HCL 1 MG/ML IJ SOLN
INTRAMUSCULAR | Status: AC
  Administered 2015-12-09: 0.5 mg via INTRAVENOUS
  Filled 2015-12-09: qty 1

## 2015-12-09 MED ORDER — PROMETHAZINE HCL 25 MG/ML IJ SOLN
INTRAMUSCULAR | Status: AC
  Administered 2015-12-09: 6.25 mg via INTRAVENOUS
  Filled 2015-12-09: qty 1

## 2015-12-09 MED ORDER — POTASSIUM CHLORIDE IN NACL 20-0.9 MEQ/L-% IV SOLN
INTRAVENOUS | Status: DC
  Administered 2015-12-09 – 2015-12-13 (×5): via INTRAVENOUS
  Filled 2015-12-09 (×9): qty 1000

## 2015-12-09 MED ORDER — MORPHINE SULFATE (PF) 2 MG/ML IV SOLN
2.0000 mg | INTRAVENOUS | Status: DC | PRN
  Administered 2015-12-09 – 2015-12-13 (×8): 2 mg via INTRAVENOUS
  Filled 2015-12-09 (×8): qty 1

## 2015-12-09 MED ORDER — PANTOPRAZOLE SODIUM 40 MG IV SOLR
40.0000 mg | Freq: Every day | INTRAVENOUS | Status: DC
  Administered 2015-12-09 – 2015-12-13 (×5): 40 mg via INTRAVENOUS
  Filled 2015-12-09 (×5): qty 40

## 2015-12-09 MED ORDER — ROCURONIUM BROMIDE 50 MG/5ML IV SOLN
INTRAVENOUS | Status: AC
  Filled 2015-12-09: qty 1

## 2015-12-09 MED ORDER — FENTANYL CITRATE (PF) 100 MCG/2ML IJ SOLN
INTRAMUSCULAR | Status: DC | PRN
  Administered 2015-12-09 (×2): 50 ug via INTRAVENOUS
  Administered 2015-12-09: 100 ug via INTRAVENOUS

## 2015-12-09 MED ORDER — PROPOFOL 10 MG/ML IV BOLUS
INTRAVENOUS | Status: AC
  Filled 2015-12-09: qty 20

## 2015-12-09 MED ORDER — SUGAMMADEX SODIUM 200 MG/2ML IV SOLN
INTRAVENOUS | Status: DC | PRN
  Administered 2015-12-09: 300 mg via INTRAVENOUS

## 2015-12-09 MED ORDER — ONDANSETRON HCL 4 MG/2ML IJ SOLN
INTRAMUSCULAR | Status: AC
  Filled 2015-12-09: qty 2

## 2015-12-09 MED ORDER — HYDROMORPHONE HCL 1 MG/ML IJ SOLN
0.2500 mg | INTRAMUSCULAR | Status: DC | PRN
  Administered 2015-12-09 (×2): 0.5 mg via INTRAVENOUS

## 2015-12-09 MED ORDER — PROMETHAZINE HCL 25 MG/ML IJ SOLN
6.2500 mg | Freq: Once | INTRAMUSCULAR | Status: AC
  Administered 2015-12-09: 6.25 mg via INTRAVENOUS

## 2015-12-09 MED ORDER — LIDOCAINE HCL (CARDIAC) 20 MG/ML IV SOLN
INTRAVENOUS | Status: DC | PRN
  Administered 2015-12-09: 60 mg via INTRAVENOUS

## 2015-12-09 SURGICAL SUPPLY — 40 items
BLADE SURG 10 STRL SS (BLADE) ×2 IMPLANT
CANISTER SUCTION 2500CC (MISCELLANEOUS) ×3 IMPLANT
CHLORAPREP W/TINT 26ML (MISCELLANEOUS) ×3 IMPLANT
COVER SURGICAL LIGHT HANDLE (MISCELLANEOUS) ×3 IMPLANT
DRAPE WARM FLUID 44X44 (DRAPE) ×3 IMPLANT
DRSG OPSITE POSTOP 4X10 (GAUZE/BANDAGES/DRESSINGS) IMPLANT
DRSG OPSITE POSTOP 4X8 (GAUZE/BANDAGES/DRESSINGS) IMPLANT
ELECT CAUTERY BLADE 6.4 (BLADE) ×2 IMPLANT
ELECT REM PT RETURN 9FT ADLT (ELECTROSURGICAL) ×3
ELECTRODE REM PT RTRN 9FT ADLT (ELECTROSURGICAL) ×2 IMPLANT
GAUZE SPONGE 4X4 12PLY STRL (GAUZE/BANDAGES/DRESSINGS) ×2 IMPLANT
GLOVE EUDERMIC 7 POWDERFREE (GLOVE) ×3 IMPLANT
GOWN STRL REUS W/ TWL LRG LVL3 (GOWN DISPOSABLE) ×2 IMPLANT
GOWN STRL REUS W/ TWL XL LVL3 (GOWN DISPOSABLE) ×2 IMPLANT
GOWN STRL REUS W/TWL LRG LVL3 (GOWN DISPOSABLE) ×3
GOWN STRL REUS W/TWL XL LVL3 (GOWN DISPOSABLE) ×3
HANDLE SUCTION POOLE (INSTRUMENTS) ×1 IMPLANT
KIT BASIN OR (CUSTOM PROCEDURE TRAY) ×3 IMPLANT
KIT ROOM TURNOVER OR (KITS) ×3 IMPLANT
NS IRRIG 1000ML POUR BTL (IV SOLUTION) ×4 IMPLANT
PACK GENERAL/GYN (CUSTOM PROCEDURE TRAY) ×3 IMPLANT
PAD ARMBOARD 7.5X6 YLW CONV (MISCELLANEOUS) ×3 IMPLANT
PENCIL BUTTON HOLSTER BLD 10FT (ELECTRODE) ×2 IMPLANT
SPECIMEN JAR LARGE (MISCELLANEOUS) IMPLANT
SPONGE LAP 18X18 X RAY DECT (DISPOSABLE) ×2 IMPLANT
STAPLER VISISTAT 35W (STAPLE) ×2 IMPLANT
SUCTION POOLE HANDLE (INSTRUMENTS) ×3
SUCTION POOLE TIP (SUCTIONS) ×3 IMPLANT
SUT PDS AB 1 TP1 96 (SUTURE) ×6 IMPLANT
SUT SILK 2 0 SH CR/8 (SUTURE) ×1 IMPLANT
SUT SILK 2 0 TIES 10X30 (SUTURE) ×1 IMPLANT
SUT SILK 3 0 SH CR/8 (SUTURE) ×1 IMPLANT
SUT SILK 3 0 TIES 10X30 (SUTURE) ×1 IMPLANT
TAPE CLOTH SURG 4X10 WHT LF (GAUZE/BANDAGES/DRESSINGS) ×2 IMPLANT
TOWEL OR 17X26 10 PK STRL BLUE (TOWEL DISPOSABLE) ×3 IMPLANT
TRAY FOLEY CATH 14FR (SET/KITS/TRAYS/PACK) ×2 IMPLANT
TRAY FOLEY CATH 16FRSI W/METER (SET/KITS/TRAYS/PACK) IMPLANT
TUBE CONNECTING 12X1/4 (SUCTIONS) ×2 IMPLANT
TUBING INSUFFLATOR HI FLOW (MISCELLANEOUS) ×2 IMPLANT
YANKAUER SUCT BULB TIP NO VENT (SUCTIONS) ×2 IMPLANT

## 2015-12-09 NOTE — Anesthesia Preprocedure Evaluation (Addendum)
Anesthesia Evaluation  Patient identified by MRN, date of birth, ID band Patient awake    Reviewed: Allergy & Precautions, H&P , NPO status , Patient's Chart, lab work & pertinent test results  Airway Mallampati: II  TM Distance: >3 FB Neck ROM: full    Dental  (+) Dental Advisory Given, Caps All upper front teeth are capped:   Pulmonary neg pulmonary ROS, former smoker,  bronchiectasis   Pulmonary exam normal breath sounds clear to auscultation       Cardiovascular Normal cardiovascular exam+ dysrhythmias Atrial Fibrillation + Valvular Problems/Murmurs MVP  Rhythm:regular Rate:Normal  ECG-AF on 3/5. Repeat ECG shows persistent AF.  Called Dr. Johnsie Cancel in cardiology and he said we could go ahead and do the surgery now and he would see her post op for the AF.  Rate is well controlled.   Neuro/Psych negative neurological ROS  negative psych ROS   GI/Hepatic negative GI ROS, Neg liver ROS, GERD  Medicated and Controlled,  Endo/Other  negative endocrine ROSHypothyroidism Hyperthyroidism  History Graves disease but hypothyroid now  Renal/GU negative Renal ROS  negative genitourinary   Musculoskeletal   Abdominal   Peds  Hematology negative hematology ROS (+)   Anesthesia Other Findings   Reproductive/Obstetrics negative OB ROS                          Anesthesia Physical Anesthesia Plan  ASA: III and emergent  Anesthesia Plan: General   Post-op Pain Management:    Induction: Intravenous, Rapid sequence and Cricoid pressure planned  Airway Management Planned: Oral ETT  Additional Equipment:   Intra-op Plan:   Post-operative Plan: Extubation in OR  Informed Consent: I have reviewed the patients History and Physical, chart, labs and discussed the procedure including the risks, benefits and alternatives for the proposed anesthesia with the patient or authorized representative who has indicated  his/her understanding and acceptance.   Dental Advisory Given  Plan Discussed with: CRNA and Surgeon  Anesthesia Plan Comments:        Anesthesia Quick Evaluation

## 2015-12-09 NOTE — Anesthesia Postprocedure Evaluation (Signed)
Anesthesia Post Note  Patient: Tracy [name]  Procedure(s) Performed: Procedure(s) (LRB): EXPLORATORY LAPAROTOMY, LYSIS OF ADHESIONS  (N/A)  Patient location during evaluation: PACU Anesthesia Type: General Level of consciousness: awake and alert Pain management: pain level controlled Vital Signs Assessment: post-procedure vital signs reviewed and stable Respiratory status: spontaneous breathing, nonlabored ventilation, respiratory function stable and patient connected to nasal cannula oxygen Cardiovascular status: blood pressure returned to baseline and stable Postop Assessment: no signs of nausea or vomiting Anesthetic complications: no    Last Vitals:  Filed Vitals:   12/09/15 1200 12/09/15 1213  BP:  136/74  Pulse: 103 98  Temp: 36.7 C 36.7 C  Resp: 16 17    Last Pain:  Filed Vitals:   12/09/15 1219  PainSc: Asleep                 Stuart Mirabile L

## 2015-12-09 NOTE — Progress Notes (Signed)
Gen. Surgery:  Had a stable night.  Biggest complaint is anxiety about surgery today.  No stool or flatus overnight Essentially no pain. Afebrile.  Alert.  Other than anxiety does not appear to be in distress. Abdomen soft.  Borderline distended.  Not really tender.  NG bilious.  Assessment/plan:  SBO.  Not resolving.  Suspect adhesions and pelvis. To OR today for diagnostic laparoscopy, possible laparotomy. I have answered all of her questions.  History appendectomy and tubal ligation The VTE prophylaxis.   continue SCD.  Hold Lovenox this morning.   Edsel Petrin. Dalbert Batman, M.D., [facility] General and Minimally invasive Surgery Breast and Colorectal Surgery Office:   [phone]

## 2015-12-09 NOTE — Op Note (Signed)
Patient Name:           Tracy [name]   Date of Surgery:        12/09/2015  Pre op Diagnosis:      Small bowel obstruction secondary to adhesions  Post op Diagnosis:    Same  Procedure:                 Diagnostic laparoscopy, laparotomy, lysis of adhesions  Surgeon:                     Edsel Petrin. Dalbert Batman, M.D., FACS  Assistant:                      OR staff  Operative Indications:   This is a 75 year old Caucasian female with a remote history of elective tubal ligation and appendectomy at the same time through a short Pfannenstiel incision.  She was admitted on March 5 with abdominal pain nausea and vomiting and a CT scan was suggested small bowel obstruction with transition zone in the pelvis.  After placement of nasogastric tube she became completely asymptomatic and abdominal films were subsequently inconclusive for SBO versus ileus.  She passed some flatus.  We thought she was getting better and we clamped her NG tube and allowed a clear liquid diet.  She became obstructed again yesterday and we had to put the NG back on suction.  Her abdomen has remained nontender..  X-rays yesterday looked more like a small bowel structures and she is brought to the operating room for release of her small bowel obstruction.  Operative Findings:       She was obstructed in the mid to proximal ileum due to adhesions in the right pelvis.  Once we took a couple of adhesions down we could pull the small bowel up and see that she had a short loop which had a proximal and distal constricting area which opened up quite nicely and seemed quite viable.  There is no ischemia or leakage.  We were able to milk small bowel content distally through both of these areas.  We ran the small bowel from proximal to distal and distal to proximal and we were able to identify normal bowel for the rest of the way.  She had some benign appearing ascites.  Procedure in Detail:          Following the induction of general endotracheal  anesthesia a Foley catheter was placed.  The abdomen was prepped and draped in a sterile fashion.  Intravenous antibiotics were given.  Surgical timeout was performed.     A 5 mm trocar was placed in the left subcostal area under direct vision.  Peritoneal entry was uneventful and there were no adhesions.  Pneumoperitoneum was created.  The bowel was significantly dilated and we had almost no visualization and the bowel and the anterior abdominal wall.  We elected to convert to an open laparotomy due to the inability to visualize the anatomy.  A lower midline incision was made.  The fascia was incised in the midline and the abdominal cavity entered under direct vision.  The abdomen was explored with findings as described above.  After taking down all the adhesions we made her that we evacuated all of the ascites.  It took great care to run the bowel from both ends and found no other abnormalities.  We took great care to place the small bowel back in its anatomic position without twisting the  mesentery.  Everything looked healthy at that point other than a little hemorrhagic serosal change proximally due to handling.  We pulled what little omentum there was down.  The midline fascia was closed with a running suture of #1 double stent PDS and the skin closed with skin staples.  Bandages were placed the patient taken to PACU in stable condition.  EBL 20 mL.  Counts correct.  Complications none.    It should be noted that she was noted to be  in atrial fibrillation in the holding area.  There was no rapid ventricular response.  Dr. Landry Dyke discussed this  Me and Dr. Acie Fredrickson of the cardiology service.  Dr. Acie Fredrickson advised to proceed with surgery and cardiology was going to consult postop.     Edsel Petrin. Dalbert Batman, M.D., FACS General and Minimally Invasive Surgery Breast and Colorectal Surgery  12/09/2015 11:04 AM

## 2015-12-09 NOTE — Progress Notes (Signed)
Report given to M. Judye Bos, RN ([employer] relief)

## 2015-12-09 NOTE — Anesthesia Procedure Notes (Signed)
Procedure Name: Intubation Date/Time: 12/09/2015 10:00 AM Performed by: Izora Gala Pre-anesthesia Checklist: Patient identified, Emergency Drugs available, Suction available and Patient being monitored Patient Re-evaluated:Patient Re-evaluated prior to inductionOxygen Delivery Method: Circle system utilized Preoxygenation: Pre-oxygenation with 100% oxygen Intubation Type: IV induction Ventilation: Mask ventilation without difficulty Laryngoscope Size: Miller and 3 Grade View: Grade I Tube type: Oral Tube size: 7.0 mm Number of attempts: 1 Airway Equipment and Method: Stylet

## 2015-12-09 NOTE — Consult Note (Signed)
CARDIOLOGY CONSULT NOTE       Patient ID: MERCER BENTE MRN: [id number] DOB/AGE: Jun 12, 1941 75 y.o.  Admit date: 12/04/2015 Referring Physician:  Wyonia Hough Primary Physician: Aretta Nip, MD Primary Cardiologist:  New/Saburo Luger Reason for Consultation:  afib  Principal Problem:   Small bowel obstruction [facility]) Active Problems:   Hypothyroidism   Generalized abdominal pain   Nausea and vomiting   Atrial fibrillation (HCC)   SBO (small bowel obstruction) (HCC)   HPI:  75 y.o. admitted with SBO.  I received a call from anesthesia about patient being afib in PACU.  Admitted 3/5 with no mention of arrhythmia  Admitting ECG sowed afib.  Rate has been ok despite not being on AV nodal blocking drug.  Patient sees [place] Rankin as primary.  She is a bit lethargic post op but denies history of afib.  Does indicate history of MVP.  Niece who is only family member available does not indicate previous cardiac disease. Patient did fine with surgery. No dyspnea, TIA, chest pain or hypotension.  Currently with NG tube in Surgery relatively benign with lysis of adhesions.    ROS All other systems reviewed and negative except as noted above  Past Medical History  Diagnosis Date  . Mitral valve prolapse   . GERD (gastroesophageal reflux disease)   . Graves disease   . Hyperlipidemia   . Rosacea   . Hypothyroidism   . Colon polyps   . Duodenal adenoma   . LPRD (laryngopharyngeal reflux disease)     Family History  Problem Relation Age of Onset  . Cancer Father     lung  . Cancer Mother     lung  . Cancer Paternal Grandfather     stomach  . Diabetes Paternal Grandmother   . Kidney failure Paternal Grandmother   . Heart attack Maternal Grandfather   . Cancer Brother     lung    Social History   Social History  . Marital Status: Widowed    Spouse Name: N/A  . Number of Children: 3  . Years of Education: N/A   Occupational History  . retired     Network engineer   Social History  Main Topics  . Smoking status: Former Smoker -- 0.90 packs/day for 25 years    Types: Cigarettes    Quit date: 12/28/1981  . Smokeless tobacco: Never Used  . Alcohol Use: Yes     Comment: several  . Drug Use: No  . Sexual Activity: Not on file   Other Topics Concern  . Not on file   Social History Narrative    Past Surgical History  Procedure Laterality Date  . Cataract extraction, bilateral    . Ruptured disk    . Pilonidal cyst / sinus excision    . Tubal ligation    . Colonoscopy      x2  . Esophagogastroduodenoscopy      x2  . Appendectomy       . antiseptic oral rinse  7 mL Mouth Rinse BID  . diltiazem (CARDIZEM) infusion  5-15 mg/hr Intravenous To OR  . [START ON 12/10/2015] enoxaparin (LOVENOX) injection  40 mg Subcutaneous Q24H  . famotidine (PEPCID) IV  20 mg Intravenous Q12H  . levothyroxine  37.5 mcg Intravenous Daily  . pantoprazole (PROTONIX) IV  40 mg Intravenous QHS  . sorbitol, milk of mag, mineral oil, glycerin (SMOG) enema  960 mL Rectal Once   . 0.9 % NaCl with KCl 20 mEq /  L    . lactated ringers      Physical Exam: Blood pressure 136/74, pulse 98, temperature 98 F (36.7 C), temperature source Oral, resp. rate 17, height 5\' 4"  (1.626 m), weight 72.712 kg (160 lb 4.8 oz), SpO2 100 %.   Affect appropriate Lethargic  HEENT: NG tube in place  Neck supple with no adenopathy JVP normal no bruits no thyromegaly Lungs clear with no wheezing and good diaphragmatic motion Heart:  S1/S2 no murmur, no rub, gallop or click PMI normal Abdomen: post laparoscopic surgery mild distension  no bruit.  No HSM or HJR Distal pulses intact with no bruits No edema Neuro non-focal Skin warm and dry No muscular weakness   Labs:   Lab Results  Component Value Date   WBC 9.6 12/08/2015   HGB 13.4 12/08/2015   HCT 40.4 12/08/2015   MCV 96.4 12/08/2015   PLT 161 12/08/2015    Recent Labs Lab 12/05/15 0510  12/09/15 0519  NA 141  < > 144  K 4.3  < >  3.0*  CL 105  < > 106  CO2 24  < > 27  BUN 12  < > 10  CREATININE 0.81  < > 0.78  CALCIUM 8.8*  < > 8.4*  PROT 5.4*  --   --   BILITOT 0.7  --   --   ALKPHOS 48  --   --   ALT 16  --   --   AST 23  --   --   GLUCOSE 112*  < > 111*  < > = values in this interval not displayed. No results found for: CKTOTAL, CKMB, CKMBINDEX, TROPONINI No results found for: CHOL No results found for: HDL No results found for: LDLCALC No results found for: TRIG No results found for: CHOLHDL No results found for: LDLDIRECT    Radiology: Ct Abdomen Pelvis W Contrast  12/04/2015  CLINICAL DATA:  75 year old female with lower abdominal pain and nausea. EXAM: CT ABDOMEN AND PELVIS WITH CONTRAST TECHNIQUE: Multidetector CT imaging of the abdomen and pelvis was performed using the standard protocol following bolus administration of intravenous contrast. CONTRAST:  129mL OMNIPAQUE IOHEXOL 300 MG/ML  SOLN COMPARISON:  CT dated 09/14/2008 FINDINGS: The visualized lung bases are clear. No intra-abdominal free air or free fluid. The liver, gallbladder, pancreas, spleen, adrenal glands appear unremarkable. Mild bilateral renal atrophy. There is no hydronephrosis on either side. The visualized ureters and urinary bladder appear unremarkable. The uterus appear grossly unremarkable. Multiple top-normal caliber fluid-filled loops of small bowel noted in the lower abdomen and pelvis. These measure up to 2.4 cm in diameter. The distal and terminal ileum are collapsed. A focal area of narrowing in the small bowel in the right hemipelvis (series 201 image 56 and coronal series 203, image 57) noted likely related to underlying adhesions. There is a small probable metallic clip adjacent to this area likely from tubal ligation. Moderate stool noted throughout the colon. Appendectomy. The abdominal aorta and IVC appear unremarkable. No portal venous gas identified. There is no adenopathy. Small fat containing umbilical hernia. The abdominal  wall soft tissues appear unremarkable. There is mild degenerative changes of spine. No acute fracture. IMPRESSION: Multiple top-normal caliber fluid-filled loops of small bowel in the mid and lower abdomen with transition zone in the right hemipelvis. Findings most consistent with an early small-bowel obstruction likely related to adhesions. Clinical correlation and follow-up recommended. Electronically Signed   By: Anner Crete M.D.   On:  12/04/2015 23:48   Dg Abd 2 Views  12/07/2015  CLINICAL DATA:  75 year old female with nausea and vomiting with abdominal pain for 3 days. Small bowel obstruction suspected on recent CT Abdomen and Pelvis. Initial encounter. EXAM: ABDOMEN - 2 VIEW COMPARISON:  12/06/2015 and earlier. FINDINGS: Upright and supine views of the abdomen. Enteric tube remains in place, side hole the level of the gastric body. Minimally changed appearance of multiple mid abdominal air in fluid containing small bowel loops. Fluid levels persist on upright images. Paucity of colonic gas, but the large bowel appears largely decompressed as seen on the recent CT. Overall, the gas pattern has not significantly improved since 12/04/2015. No pneumoperitoneum. Stable lung bases. No acute osseous abnormality identified. IMPRESSION: 1. Enteric tube remains in place, side hole to level of the gastric body. 2. No significant improvement in bowel gas pattern since 12/04/2015, suggesting small bowel obstruction or small bowel ileus. 3. No free air. Electronically Signed   By: Genevie Ann M.D.   On: 12/07/2015 09:02   Dg Abd 2 Views  12/06/2015  CLINICAL DATA:  Abdominal pain, mild small bowel dilatation on recent CT examination. EXAM: ABDOMEN - 2 VIEW COMPARISON:  12/05/2015 FINDINGS: Nasogastric catheter is noted within the distal stomach. Few scattered loops of mildly dilated small bowel are noted. No free air is seen. No new obstructive changes are noted. IMPRESSION: Few loops of mildly dilated small bowel not  significantly changed from previous exam. Electronically Signed   By: Inez Catalina M.D.   On: 12/06/2015 07:40   Dg Abd Portable 1v  12/08/2015  CLINICAL DATA:  Followup small bowel obstruction. EXAM: PORTABLE ABDOMEN - 1 VIEW COMPARISON:  12/07/2015 FINDINGS: Dilated loops of small bowel appear mildly increased in diameter when compared to the previous day's study. There is some stool and air in a nondilated colon. Nasal/orogastric tube is well positioned with its tip in the distal stomach. IMPRESSION: 1. Small bowel dilation appears increased in severity from the previous day's study. Findings consistent with a persistent partial small bowel obstruction. Electronically Signed   By: Lajean Manes M.D.   On: 12/08/2015 11:36   Dg Abd Portable 1v-small Bowel Obstruction Protocol-initial, 8 Hr Delay  12/05/2015  CLINICAL DATA:  Followup small bowel obstruction. EXAM: PORTABLE ABDOMEN - 1 VIEW COMPARISON:  12/05/2015 FINDINGS: Nasogastric tube is again seen with tip in proximal stomach. Oral contrast remains within the stomach. Multiple mildly dilated small bowel loops are again seen in the mid and lower abdomen, without significant change. No significant distal progression of oral contrast material is seen. The The colon remains nondilated. These findings are suspicious for partial small bowel obstruction. IMPRESSION: Findings suspicious for partial small bowel obstruction. Electronically Signed   By: Earle Gell M.D.   On: 12/05/2015 21:05   Dg Abd Portable 1v-small Bowel Protocol-position Verification  12/05/2015  CLINICAL DATA:  Encounter for nasogastric tube placement EXAM: PORTABLE ABDOMEN - 1 VIEW COMPARISON:  Portable exam 0955 hours compared to CT abdomen and pelvis 12/04/2015 FINDINGS: Nasogastric tube coiled in proximal stomach. Normal bowel gas pattern. Few air-filled nondistended loops of small bowel in mid abdomen. Excreted contrast material within renal collecting systems and bladder. No bowel  dilatation or bowel wall thickening. Bones appear demineralized. IMPRESSION: Nasogastric tube coiled in proximal stomach. Electronically Signed   By: Lavonia Dana M.D.   On: 12/05/2015 10:23    EKG:  afbi no acute ST/T wave changes   ASSESSMENT AND PLAN:  Afib:  Duration not known but likely chronic.  Before d/c will need to start on oral calcium blocker or beta blocker. Will not start on anticoagulation immediately post op CHADVASC Score is 2 but will be 3 when she turns 75 in about 2 weeks.  Will order echo to assess atrial sizes and MVP  SBO:  Routine post op care NG tube in   This patients CHA2DS2-VASc Score and unadjusted Ischemic Stroke Rate (% per year) is equal to 2.2 % stroke rate/year from a score of 2  Above score calculated as 1 point each if present [CHF, HTN, DM, Vascular=MI/PAD/Aortic Plaque, Age if 65-74, or Female] Above score calculated as 2 points each if present [Age > 75, or Stroke/TIA/TE]   Signed: Jenkins Rouge 12/09/2015, 1:57 PM

## 2015-12-09 NOTE — Transfer of Care (Signed)
Immediate Anesthesia Transfer of Care Note  Patient: Tracy [name]  Procedure(s) Performed: Procedure(s): EXPLORATORY LAPAROTOMY, LYSIS OF ADHESIONS  (N/A)  Patient Location: PACU  Anesthesia Type:General  Level of Consciousness: awake, sedated, patient cooperative and confused  Airway & Oxygen Therapy: Patient Spontanous Breathing and Patient connected to nasal cannula oxygen  Post-op Assessment: Report given to RN, Post -op Vital signs reviewed and stable, Patient moving all extremities and Patient moving all extremities X 4  Post vital signs: Reviewed and stable  Last Vitals:  Filed Vitals:   12/09/15 0450 12/09/15 1107  BP: 121/62   Pulse: 89   Temp: 36.7 C   Resp: 17 17    Complications: No apparent anesthesia complications

## 2015-12-10 ENCOUNTER — Encounter (HOSPITAL_COMMUNITY): Payer: Self-pay | Admitting: General Surgery

## 2015-12-10 DIAGNOSIS — I481 Persistent atrial fibrillation: Secondary | ICD-10-CM

## 2015-12-10 LAB — CBC
HCT: 44.6 % (ref 36.0–46.0)
HEMOGLOBIN: 14.9 g/dL (ref 12.0–15.0)
MCH: 32.1 pg (ref 26.0–34.0)
MCHC: 33.4 g/dL (ref 30.0–36.0)
MCV: 96.1 fL (ref 78.0–100.0)
PLATELETS: 200 10*3/uL (ref 150–400)
RBC: 4.64 MIL/uL (ref 3.87–5.11)
RDW: 12.5 % (ref 11.5–15.5)
WBC: 8.6 10*3/uL (ref 4.0–10.5)

## 2015-12-10 LAB — BASIC METABOLIC PANEL
ANION GAP: 12 (ref 5–15)
BUN: 9 mg/dL (ref 6–20)
CALCIUM: 8 mg/dL — AB (ref 8.9–10.3)
CO2: 25 mmol/L (ref 22–32)
CREATININE: 0.74 mg/dL (ref 0.44–1.00)
Chloride: 106 mmol/L (ref 101–111)
Glucose, Bld: 98 mg/dL (ref 65–99)
Potassium: 3.9 mmol/L (ref 3.5–5.1)
SODIUM: 143 mmol/L (ref 135–145)

## 2015-12-10 LAB — HEPARIN LEVEL (UNFRACTIONATED): Heparin Unfractionated: 0.66 IU/mL (ref 0.30–0.70)

## 2015-12-10 LAB — MAGNESIUM: MAGNESIUM: 1.7 mg/dL (ref 1.7–2.4)

## 2015-12-10 MED ORDER — MENTHOL 3 MG MT LOZG
1.0000 | LOZENGE | OROMUCOSAL | Status: DC | PRN
  Filled 2015-12-10: qty 9

## 2015-12-10 MED ORDER — HEPARIN (PORCINE) IN NACL 100-0.45 UNIT/ML-% IJ SOLN
1000.0000 [IU]/h | INTRAMUSCULAR | Status: AC
  Administered 2015-12-10: 1000 [IU]/h via INTRAVENOUS
  Administered 2015-12-11: 900 [IU]/h via INTRAVENOUS
  Administered 2015-12-13: 850 [IU]/h via INTRAVENOUS
  Administered 2015-12-15: 1000 [IU]/h via INTRAVENOUS
  Filled 2015-12-10 (×6): qty 250

## 2015-12-10 MED ORDER — METOPROLOL TARTRATE 1 MG/ML IV SOLN: 5.0000 mg | INTRAVENOUS | Status: DC | PRN

## 2015-12-10 MED ORDER — METOPROLOL SUCCINATE ER 25 MG PO TB24
25.0000 mg | ORAL_TABLET | Freq: Every day | ORAL | Status: DC
  Administered 2015-12-10 – 2015-12-11 (×2): 25 mg via ORAL
  Filled 2015-12-10 (×2): qty 1

## 2015-12-10 NOTE — Progress Notes (Addendum)
Pt Afib on tele, HR now 120-140's, MD notified awaiting orders, denies pain, pt stable, MD ordered EKG and 25 mg metoprolol po, HR now 90-100's

## 2015-12-10 NOTE — Progress Notes (Signed)
Pt started on heparin gtt @ 10 ml/hr

## 2015-12-10 NOTE — Progress Notes (Signed)
ANTICOAGULATION CONSULT NOTE - Initial Consult  Pharmacy Consult for Heparin Indication: atrial fibrillation  Allergies  Allergen Reactions  . Adhesive [Tape]     Surgical tape  . Avelox [Moxifloxacin Hcl In Nacl] Other (See Comments)    N/V/rash/swelling  . Tussionex Pennkinetic Er [Hydrocod Polst-Cpm Polst Er] Itching and Nausea And Vomiting  . Codeine Itching, Nausea And Vomiting and Rash    ALL FORMS OF CODEINE  . Penicillins Itching, Nausea And Vomiting, Swelling and Rash    ALL FORMS OF PENICILLINS Has patient had a PCN reaction causing immediate rash, facial/tongue/throat swelling, SOB or lightheadedness with hypotension: Yes Has patient had a PCN reaction causing severe rash involving mucus membranes or skin necrosis: No Has patient had a PCN reaction that required hospitalization No Has patient had a PCN reaction occurring within the last 10 years: No If all of the above answers are "NO", then may proceed with Cephalosporin use.    Patient Measurements: Height: 5\' 4"  (162.6 cm) Weight: 160 lb 4.8 oz (72.712 kg) IBW/kg (Calculated) : 54.7 Heparin Dosing Weight: 69  Vital Signs: Temp: 98 F (36.7 C) (03/10 0651) Temp Source: Oral (03/10 0651) BP: 122/78 mmHg (03/10 0651) Pulse Rate: 96 (03/10 0651)  Labs:  Recent Labs  12/08/15 1053 12/09/15 0519 12/09/15 1440 12/10/15 0553  HGB 13.4  --  14.8 14.9  HCT 40.4  --  43.9 44.6  PLT 161  --  197 200  CREATININE 0.86 0.78 0.76 0.74    Estimated Creatinine Clearance: 60.3 mL/min (by C-G formula based on Cr of 0.74).   Medical History: Past Medical History  Diagnosis Date  . Mitral valve prolapse   . GERD (gastroesophageal reflux disease)   . Graves disease   . Hyperlipidemia   . Rosacea   . Hypothyroidism   . Colon polyps   . Duodenal adenoma   . LPRD (laryngopharyngeal reflux disease)     Medications:  Scheduled:  . acetaminophen  1,000 mg Intravenous 4 times per day  . antiseptic oral rinse  7  mL Mouth Rinse BID  . famotidine (PEPCID) IV  20 mg Intravenous Q12H  . levothyroxine  37.5 mcg Intravenous Daily  . metoprolol succinate  25 mg Oral Daily  . pantoprazole (PROTONIX) IV  40 mg Intravenous QHS    Assessment: 75yo female pod#1 exp lap for SBO and LOA.  Pt in AFib with CHADSVASC = 3, to start anticoagulation with plan for possible DOAC on discharge if OK with surgery.  Pt has been receiving Lovenox 40mg  for VTE px, last dose at 8AM today.  Verified with ERiebockNP that OK to start Heparin.  Hg 14.9, pltc 200  Goal of Therapy:  Heparin level 0.3-0.7 units/ml, with plan to stick to lower end of range Monitor platelets by anticoagulation protocol: Yes   Plan:  Heparin 1000 units/hr, no bolus Heparin level in 8hr Daily HL, CBC Watch for s/s of bleeding F/U plans for conversion to po anticoagulation  Gracy Bruins, [facility]

## 2015-12-10 NOTE — Progress Notes (Signed)
Patient ID: Tracy [name], female   DOB: [birth year], 75 y.o.   MRN: [id number]     [facility]      [address] [place], [place] [address]    Phone: [phone] FAX: [phone]     Subjective: No flatus.  No n/v.  Pain controlled well with morphine and tylenol.  Tele with afib, rate okay. VSS.  Afebrile.  Denies sob or chest pains.    Objective:  Vital signs:  Filed Vitals:   12/09/15 1422 12/09/15 2107 12/10/15 0226 12/10/15 0651  BP: 118/70 115/55 124/82 122/78  Pulse: 101 105 103 96  Temp: 98 F (36.7 C) 97.5 F (36.4 C) 98.9 F (37.2 C) 98 F (36.7 C)  TempSrc: Oral Oral Oral Oral  Resp: '17 19 18 18  ' Height:      Weight:      SpO2: 99% 99% 100% 98%    Last BM Date: 12/04/15  Intake/Output   Yesterday:  03/09 0701 - 03/10 0700 In: 1800 [I.V.:1800] Out: 0131 [Urine:1200; Emesis/NG output:550; Blood:25] This shift:     Physical Exam: General: Pt awake/alert/oriented x4 in no acute distress Chest: cta.  No chest wall pain w good excursion CV:  s1s2 irregularly irregular rate and rhythm.  MS: Normal AROM mjr joints.  No obvious deformity Abdomen: Soft.  Nondistended.  Mild tenderness surrounding the midline incision.  Lap sites are c/d/i. Marland Kitchen  No evidence of peritonitis.  No incarcerated hernias. Ext:  SCDs BLE.  No mjr edema.  No cyanosis Skin: No petechiae / purpura   Problem List:   Principal Problem:   Small bowel obstruction (HCC) Active Problems:   Hypothyroidism   Generalized abdominal pain   Nausea and vomiting   Atrial fibrillation (HCC)   SBO (small bowel obstruction) (HCC)    Results:   Labs: Results for orders placed or performed during the hospital encounter of 12/04/15 (from the past 48 hour(s))  CBC     Status: None   Collection Time: 12/08/15 10:53 AM  Result Value Ref Range   WBC 9.6 4.0 - 10.5 K/uL    Comment: WHITE COUNT CONFIRMED ON SMEAR   RBC 4.19 3.87 - 5.11 MIL/uL   Hemoglobin 13.4 12.0 - 15.0 g/dL   HCT 40.4 36.0 - 46.0 %   MCV 96.4 78.0 - 100.0 fL   MCH 32.0 26.0 - 34.0 pg   MCHC 33.2 30.0 - 36.0 g/dL   RDW 12.5 11.5 - 15.5 %   Platelets 161 150 - 400 K/uL  Basic metabolic panel     Status: Abnormal   Collection Time: 12/08/15 10:53 AM  Result Value Ref Range   Sodium 144 135 - 145 mmol/L   Potassium 3.4 (L) 3.5 - 5.1 mmol/L   Chloride 107 101 - 111 mmol/L   CO2 22 22 - 32 mmol/L   Glucose, Bld 87 65 - 99 mg/dL   BUN 14 6 - 20 mg/dL   Creatinine, Ser 0.86 0.44 - 1.00 mg/dL   Calcium 8.6 (L) 8.9 - 10.3 mg/dL   GFR calc non Af Amer >60 >60 mL/min   GFR calc Af Amer >60 >60 mL/min    Comment: (NOTE) The eGFR has been calculated using the CKD EPI equation. This calculation has not been validated in all clinical situations. eGFR's persistently <60 mL/min signify possible Chronic Kidney Disease.    Anion gap 15 5 - 15  Surgical pcr screen     Status:  None   Collection Time: 12/08/15 10:44 PM  Result Value Ref Range   MRSA, PCR NEGATIVE NEGATIVE   Staphylococcus aureus NEGATIVE NEGATIVE    Comment:        The Xpert SA Assay (FDA approved for NASAL specimens in patients over 82 years of age), is one component of a comprehensive surveillance program.  Test performance has been validated by [facility] for patients greater than or equal to 35 year old. It is not intended to diagnose infection nor to guide or monitor treatment.   Basic metabolic panel     Status: Abnormal   Collection Time: 12/09/15  5:19 AM  Result Value Ref Range   Sodium 144 135 - 145 mmol/L   Potassium 3.0 (L) 3.5 - 5.1 mmol/L   Chloride 106 101 - 111 mmol/L   CO2 27 22 - 32 mmol/L   Glucose, Bld 111 (H) 65 - 99 mg/dL   BUN 10 6 - 20 mg/dL   Creatinine, Ser 0.78 0.44 - 1.00 mg/dL   Calcium 8.4 (L) 8.9 - 10.3 mg/dL   GFR calc non Af Amer >60 >60 mL/min   GFR calc Af Amer >60 >60 mL/min    Comment: (NOTE) The eGFR has been calculated using the CKD EPI  equation. This calculation has not been validated in all clinical situations. eGFR's persistently <60 mL/min signify possible Chronic Kidney Disease.    Anion gap 11 5 - 15  CBC     Status: Abnormal   Collection Time: 12/09/15  2:40 PM  Result Value Ref Range   WBC 11.4 (H) 4.0 - 10.5 K/uL   RBC 4.58 3.87 - 5.11 MIL/uL   Hemoglobin 14.8 12.0 - 15.0 g/dL   HCT 43.9 36.0 - 46.0 %   MCV 95.9 78.0 - 100.0 fL   MCH 32.3 26.0 - 34.0 pg   MCHC 33.7 30.0 - 36.0 g/dL   RDW 12.4 11.5 - 15.5 %   Platelets 197 150 - 400 K/uL  Creatinine, serum     Status: None   Collection Time: 12/09/15  2:40 PM  Result Value Ref Range   Creatinine, Ser 0.76 0.44 - 1.00 mg/dL   GFR calc non Af Amer >60 >60 mL/min   GFR calc Af Amer >60 >60 mL/min    Comment: (NOTE) The eGFR has been calculated using the CKD EPI equation. This calculation has not been validated in all clinical situations. eGFR's persistently <60 mL/min signify possible Chronic Kidney Disease.   Basic metabolic panel     Status: Abnormal   Collection Time: 12/10/15  5:53 AM  Result Value Ref Range   Sodium 143 135 - 145 mmol/L   Potassium 3.9 3.5 - 5.1 mmol/L    Comment: DELTA CHECK NOTED   Chloride 106 101 - 111 mmol/L   CO2 25 22 - 32 mmol/L   Glucose, Bld 98 65 - 99 mg/dL   BUN 9 6 - 20 mg/dL   Creatinine, Ser 0.74 0.44 - 1.00 mg/dL   Calcium 8.0 (L) 8.9 - 10.3 mg/dL   GFR calc non Af Amer >60 >60 mL/min   GFR calc Af Amer >60 >60 mL/min    Comment: (NOTE) The eGFR has been calculated using the CKD EPI equation. This calculation has not been validated in all clinical situations. eGFR's persistently <60 mL/min signify possible Chronic Kidney Disease.    Anion gap 12 5 - 15  CBC     Status: None   Collection Time: 12/10/15  5:53 AM  Result Value Ref Range   WBC 8.6 4.0 - 10.5 K/uL   RBC 4.64 3.87 - 5.11 MIL/uL   Hemoglobin 14.9 12.0 - 15.0 g/dL   HCT 44.6 36.0 - 46.0 %   MCV 96.1 78.0 - 100.0 fL   MCH 32.1 26.0 - 34.0  pg   MCHC 33.4 30.0 - 36.0 g/dL   RDW 12.5 11.5 - 15.5 %   Platelets 200 150 - 400 K/uL  Magnesium     Status: None   Collection Time: 12/10/15  5:53 AM  Result Value Ref Range   Magnesium 1.7 1.7 - 2.4 mg/dL    Imaging / Studies: Dg Abd Portable 1v  12/08/2015  CLINICAL DATA:  Followup small bowel obstruction. EXAM: PORTABLE ABDOMEN - 1 VIEW COMPARISON:  12/07/2015 FINDINGS: Dilated loops of small bowel appear mildly increased in diameter when compared to the previous day's study. There is some stool and air in a nondilated colon. Nasal/orogastric tube is well positioned with its tip in the distal stomach. IMPRESSION: 1. Small bowel dilation appears increased in severity from the previous day's study. Findings consistent with a persistent partial small bowel obstruction. Electronically Signed   By: Lajean Manes M.D.   On: 12/08/2015 11:36    Medications / Allergies:  Scheduled Meds: . acetaminophen  1,000 mg Intravenous 4 times per day  . antiseptic oral rinse  7 mL Mouth Rinse BID  . diltiazem (CARDIZEM) infusion  5-15 mg/hr Intravenous To OR  . enoxaparin (LOVENOX) injection  40 mg Subcutaneous Q24H  . famotidine (PEPCID) IV  20 mg Intravenous Q12H  . levothyroxine  37.5 mcg Intravenous Daily  . pantoprazole (PROTONIX) IV  40 mg Intravenous QHS   Continuous Infusions: . 0.9 % NaCl with KCl 20 mEq / L 100 mL/hr at 12/09/15 1538  . lactated ringers     PRN Meds:.albuterol, morphine injection, ondansetron **OR** ondansetron (ZOFRAN) IV  Antibiotics: Anti-infectives    None        Assessment/Plan SBO POD#1 diagnostic laparoscopy, laparotomy, LOA---Dr. Dalbert Batman -continue with NGT, bowel rest -up to chair, mobilize, PT eval -IS Hypothyroidism-IV levothyroxine  Atrial fibrillation-appreciate cardiology assistance. Continue cardiac monitoring.  Recommend low dose PO cardizem when able to POs as well as NOAC prior to DC. ?does she need higher dose of lovenox  FEN-NPO, gentle  IVF.  Tylenol IV, morphine VTE prophylaxis-SCD/lovenox Dispo-ileus    Erby Pian, [facility] Pager [phone]) For consults and floor pages call [phone](7A-4:30P)  12/10/2015 8:58 AM

## 2015-12-10 NOTE — Addendum Note (Signed)
Addendum  created 12/10/15 1200 by Izora Gala, CRNA   Modules edited: Anesthesia Events, Narrator   Narrator:  Narrator: Event Log Edited

## 2015-12-10 NOTE — Progress Notes (Signed)
ANTICOAGULATION CONSULT NOTE - Follow Up Consult  Pharmacy Consult for heparin Indication: atrial fibrillation    Labs:  Recent Labs  12/08/15 1053 12/09/15 0519 12/09/15 1440 12/10/15 0553 12/10/15 2230  HGB 13.4  --  14.8 14.9  --   HCT 40.4  --  43.9 44.6  --   PLT 161  --  197 200  --   HEPARINUNFRC  --   --   --   --  0.66  CREATININE 0.86 0.78 0.76 0.74  --      Assessment: 75yo female therapeutic on heparin with initial dosing for Afib though plan for keeping at lower end of goal.  Goal of Therapy:  Heparin level 0.3-0.7 units/ml   Plan:  Will decrease heparin gtt slightly to 900 units/hr to prevent going above goal and f/u w/ am labs.  Wynona Neat, PharmD, BCPS  12/10/2015,11:45 PM

## 2015-12-10 NOTE — Progress Notes (Signed)
Patient ID: Tracy [name], female   DOB: [birth year], 75 y.o.   MRN: [id number]    Subjective:  Denies SSCP, palpitations or Dyspnea Abdomen still tender   Objective:  Filed Vitals:   12/09/15 1422 12/09/15 2107 12/10/15 0226 12/10/15 0651  BP: 118/70 115/55 124/82 122/78  Pulse: 101 105 103 96  Temp: 98 F (36.7 C) 97.5 F (36.4 C) 98.9 F (37.2 C) 98 F (36.7 C)  TempSrc: Oral Oral Oral Oral  Resp: 17 19 18 18   Height:      Weight:      SpO2: 99% 99% 100% 98%    Intake/Output from previous day:  Intake/Output Summary (Last 24 hours) at 12/10/15 [id number] Last data filed at 12/10/15 0654  Gross per 24 hour  Intake   1800 ml  Output   1425 ml  Net    375 ml    Physical Exam: Affect appropriate Elderly white female  HEENT: normal Neck supple with no adenopathy JVP normal no bruits no thyromegaly Lungs clear with no wheezing and good diaphragmatic motion Heart:  S1/S2 no murmur, no rub, gallop or click PMI normal Abdomen: s/p surgery lysis of adhesions  NG tube draining  no bruit.  No HSM or HJR Distal pulses intact with no bruits No edema Neuro non-focal Skin warm and dry No muscular weakness   Lab Results: Basic Metabolic Panel:  Recent Labs  12/09/15 0519 12/09/15 1440 12/10/15 0553  NA 144  --  143  K 3.0*  --  3.9  CL 106  --  106  CO2 27  --  25  GLUCOSE 111*  --  98  BUN 10  --  9  CREATININE 0.78 0.76 0.74  CALCIUM 8.4*  --  8.0*  MG  --   --  1.7   Liver Function Tests: No results for input(s): AST, ALT, ALKPHOS, BILITOT, PROT, ALBUMIN in the last 72 hours. No results for input(s): LIPASE, AMYLASE in the last 72 hours. CBC:  Recent Labs  12/09/15 1440 12/10/15 0553  WBC 11.4* 8.6  HGB 14.8 14.9  HCT 43.9 44.6  MCV 95.9 96.1  PLT 197 200    Imaging: Dg Abd Portable 1v  12/08/2015  CLINICAL DATA:  Followup small bowel obstruction. EXAM: PORTABLE ABDOMEN - 1 VIEW COMPARISON:  12/07/2015 FINDINGS: Dilated loops of small bowel appear  mildly increased in diameter when compared to the previous day's study. There is some stool and air in a nondilated colon. Nasal/orogastric tube is well positioned with its tip in the distal stomach. IMPRESSION: 1. Small bowel dilation appears increased in severity from the previous day's study. Findings consistent with a persistent partial small bowel obstruction. Electronically Signed   By: Lajean Manes M.D.   On: 12/08/2015 11:36    Cardiac Studies:  ECG:  afib no acute ST changes    Telemetry:  afib rates 80-100  Echo: pending   Medications:   . acetaminophen  1,000 mg Intravenous 4 times per day  . antiseptic oral rinse  7 mL Mouth Rinse BID  . diltiazem (CARDIZEM) infusion  5-15 mg/hr Intravenous To OR  . enoxaparin (LOVENOX) injection  40 mg Subcutaneous Q24H  . famotidine (PEPCID) IV  20 mg Intravenous Q12H  . levothyroxine  37.5 mcg Intravenous Daily  . pantoprazole (PROTONIX) IV  40 mg Intravenous QHS  . sorbitol, milk of mag, mineral oil, glycerin (SMOG) enema  960 mL Rectal Once     . 0.9 % NaCl with  KCl 20 mEq / L 100 mL/hr at 12/09/15 1538  . lactated ringers      Assessment/Plan:   Afib:  Unknown duration CHADVASC 2 but will be 3 for age on 3/28  Would start anticoagulation ? NOAC on hospital d/c When ok with surgery and taking PO.  Currently NPO with NG tube in place will also also need low dose cardizem for  Rate control would not start now as would have to be iv and rate not that bad  SBO  Still tending NG tube draining suspect it will stay in another 24 hrs   Jenkins Rouge 12/10/2015, 8:12 AM

## 2015-12-10 NOTE — Care Management Important Message (Signed)
Important Message  Patient Details  Name: Tracy [name] MRN: [id number] Date of Birth: 02/11/1941   Medicare Important Message Given:  Yes    Wilfrid Hyser P Keys 12/10/2015, 1:09 PM

## 2015-12-11 LAB — CBC
HEMATOCRIT: 40.8 % (ref 36.0–46.0)
Hemoglobin: 13.2 g/dL (ref 12.0–15.0)
MCH: 31.6 pg (ref 26.0–34.0)
MCHC: 32.4 g/dL (ref 30.0–36.0)
MCV: 97.6 fL (ref 78.0–100.0)
PLATELETS: 198 10*3/uL (ref 150–400)
RBC: 4.18 MIL/uL (ref 3.87–5.11)
RDW: 12.8 % (ref 11.5–15.5)
WBC: 8.8 10*3/uL (ref 4.0–10.5)

## 2015-12-11 LAB — HEPARIN LEVEL (UNFRACTIONATED): Heparin Unfractionated: 0.56 IU/mL (ref 0.30–0.70)

## 2015-12-11 MED ORDER — PHENOL 1.4 % MT LIQD
1.0000 | OROMUCOSAL | Status: DC | PRN
  Filled 2015-12-11: qty 177

## 2015-12-11 MED ORDER — METOPROLOL TARTRATE 1 MG/ML IV SOLN
5.0000 mg | Freq: Four times a day (QID) | INTRAVENOUS | Status: DC
  Administered 2015-12-11 – 2015-12-15 (×16): 5 mg via INTRAVENOUS
  Filled 2015-12-11 (×16): qty 5

## 2015-12-11 MED ORDER — MENTHOL 3 MG MT LOZG: 1.0000 | LOZENGE | OROMUCOSAL | Status: DC | PRN

## 2015-12-11 NOTE — Progress Notes (Signed)
ANTICOAGULATION CONSULT NOTE - Follow Up Consult  Pharmacy Consult for heparin Indication: atrial fibrillation  Allergies  Allergen Reactions  . Adhesive [Tape]     Surgical tape  . Avelox [Moxifloxacin Hcl In Nacl] Other (See Comments)    N/V/rash/swelling  . Tussionex Pennkinetic Er [Hydrocod Polst-Cpm Polst Er] Itching and Nausea And Vomiting  . Codeine Itching, Nausea And Vomiting and Rash    ALL FORMS OF CODEINE  . Penicillins Itching, Nausea And Vomiting, Swelling and Rash    ALL FORMS OF PENICILLINS Has patient had a PCN reaction causing immediate rash, facial/tongue/throat swelling, SOB or lightheadedness with hypotension: Yes Has patient had a PCN reaction causing severe rash involving mucus membranes or skin necrosis: No Has patient had a PCN reaction that required hospitalization No Has patient had a PCN reaction occurring within the last 10 years: No If all of the above answers are "NO", then may proceed with Cephalosporin use.    Patient Measurements: Height: 5\' 4"  (162.6 cm) Weight: 160 lb 4.8 oz (72.712 kg) IBW/kg (Calculated) : 54.7 Heparin Dosing Weight:   Vital Signs: Temp: 97.5 F (36.4 C) (03/11 0502) Temp Source: Oral (03/11 0502) BP: 141/82 mmHg (03/11 0502) Pulse Rate: 96 (03/11 0502)  Labs:  Recent Labs  12/09/15 0519 12/09/15 1440 12/10/15 0553 12/10/15 2230 12/11/15 0615  HGB  --  14.8 14.9  --  13.2  HCT  --  43.9 44.6  --  40.8  PLT  --  197 200  --  198  HEPARINUNFRC  --   --   --  0.66 0.56  CREATININE 0.78 0.76 0.74  --   --     Estimated Creatinine Clearance: 60.3 mL/min (by C-G formula based on Cr of 0.74).   Medications:  Scheduled:  . antiseptic oral rinse  7 mL Mouth Rinse BID  . famotidine (PEPCID) IV  20 mg Intravenous Q12H  . levothyroxine  37.5 mcg Intravenous Daily  . metoprolol succinate  25 mg Oral Daily  . pantoprazole (PROTONIX) IV  40 mg Intravenous QHS    Assessment: 75yo female on heparin for AFib, with  therapeutic heparin level this AM though aiming for lower end of goal range due to surgery on 3/9.  Hg and pltc wnl, stable.  No bleeding noted.  Goal of Therapy:  Heparin level 0.3-0.7 units/ml Monitor platelets by anticoagulation protocol: Yes   Plan:  Decrease heparin to 850 units/hr Watch for s/s bleeding F/U AM labs F/U plans for oral [facility]  Gracy Bruins, PharmD Clinical Pharmacist [facility]

## 2015-12-11 NOTE — Evaluation (Signed)
Physical Therapy Evaluation Patient Details Name: Tracy [name] MRN: [id number] DOB: 08/28/1941 Today's Date: 12/11/2015   History of Present Illness  Patient is a 75 yo female  who is POD#2 diagnostic laparoscopy, laparotomy.   Clinical Impression  Patient demonstrates deficits in functional mobility as indicated below. Will need continued skilled PT to address deficits and maximize function. Will see as indicated and progress as tolerated.    Follow Up Recommendations Supervision for mobility/OOB    Equipment Recommendations  None recommended by PT    Recommendations for Other Services       Precautions / Restrictions Precautions Precautions:  (NG tube) Restrictions Weight Bearing Restrictions: No      Mobility  Bed Mobility               General bed mobility comments: received in chair  Transfers Overall transfer level: Needs assistance Equipment used: 1 person hand held assist Transfers: Sit to/from Stand Sit to Stand: Supervision            Ambulation/Gait Ambulation/Gait assistance: Supervision;Min assist Ambulation Distance (Feet): 240 Feet Assistive device: 1 person hand held assist Gait Pattern/deviations: WFL(Within Functional Limits) Gait velocity: decreased Gait velocity interpretation: Below normal speed for age/gender General Gait Details: slightly slow and guarded, VCs for increased speed, 2 noted LOB requiring minimal assist to steady  Stairs            Wheelchair Mobility    Modified Rankin (Stroke Patients Only)       Balance                                             Pertinent Vitals/Pain Pain Assessment: Faces Faces Pain Scale: Hurts a little bit Pain Location: throat Pain Descriptors / Indicators: Sore;Discomfort;Grimacing Pain Intervention(s): Monitored during session    Home Living Family/patient expects to be discharged to:: Private residence Living Arrangements: Alone   Type of Home:  House Home Access: Stairs to enter Entrance Stairs-Rails: Can reach both Entrance Stairs-Number of Steps: 4 Home Layout: One level Home Equipment: None      Prior Function Level of Independence: Independent               Hand Dominance   Dominant Hand: Right    Extremity/Trunk Assessment   Upper Extremity Assessment: Overall WFL for tasks assessed           Lower Extremity Assessment: Overall WFL for tasks assessed         Communication   Communication: No difficulties  Cognition Arousal/Alertness: Awake/alert Behavior During Therapy: Flat affect Overall Cognitive Status: Within Functional Limits for tasks assessed                      General Comments      Exercises        Assessment/Plan    PT Assessment Patient needs continued PT services  PT Diagnosis Difficulty walking   PT Problem List Decreased activity tolerance;Decreased balance;Decreased mobility  PT Treatment Interventions Gait training;Stair training;Therapeutic activities;Balance training   PT Goals (Current goals can be found in the Care Plan section) Acute Rehab PT Goals Patient Stated Goal: to go home PT Goal Formulation: With patient Time For Goal Achievement: 12/25/15 Potential to Achieve Goals: Good    Frequency Min 3X/week   Barriers to discharge Decreased caregiver support (lives alone)  Co-evaluation               End of Session Equipment Utilized During Treatment: Gait belt Activity Tolerance: Patient tolerated treatment well Patient left: in chair;with call bell/phone within reach;with family/visitor present Nurse Communication: Mobility status         Time: [id number] PT Time Calculation (min) (ACUTE ONLY): 19 min   Charges:   PT Evaluation $PT Eval Moderate Complexity: 1 Procedure     PT G CodesDuncan Dull 2015/12/24, 9:55 AM Alben Deeds, PT DPT  [phone]

## 2015-12-11 NOTE — Progress Notes (Addendum)
2 Days Post-Op  Subjective: Walking in hall.  No flatus.  Sore throat.  Objective: Vital signs in last 24 hours: Temp:  [97.5 F (36.4 C)-98.2 F (36.8 C)] 97.5 F (36.4 C) (03/11 0502) Pulse Rate:  [96-101] 96 (03/11 0502) Resp:  [17-18] 18 (03/11 0502) BP: (116-141)/(66-82) 141/82 mmHg (03/11 0502) SpO2:  [97 %-99 %] 97 % (03/11 0502) Last BM Date: 12/04/15  Intake/Output from previous day: 03/10 0701 - 03/11 0700 In: 0  Out: 1950 [Urine:1000; Emesis/NG output:950] Intake/Output this shift: Total I/O In: -  Out: 150 [Urine:150]  PE: General- In NAD Abdomen-soft, incisions clean and intact, no bowel sounds  Lab Results:   Recent Labs  12/10/15 0553 12/11/15 0615  WBC 8.6 8.8  HGB 14.9 13.2  HCT 44.6 40.8  PLT 200 198   BMET  Recent Labs  12/09/15 0519 12/09/15 1440 12/10/15 0553  NA 144  --  143  K 3.0*  --  3.9  CL 106  --  106  CO2 27  --  25  GLUCOSE 111*  --  98  BUN 10  --  9  CREATININE 0.78 0.76 0.74  CALCIUM 8.4*  --  8.0*   PT/INR No results for input(s): LABPROT, INR in the last 72 hours. Comprehensive Metabolic Panel:    Component Value Date/Time   NA 143 12/10/2015 0553   NA 144 12/09/2015 0519   K 3.9 12/10/2015 0553   K 3.0* 12/09/2015 0519   CL 106 12/10/2015 0553   CL 106 12/09/2015 0519   CO2 25 12/10/2015 0553   CO2 27 12/09/2015 0519   BUN 9 12/10/2015 0553   BUN 10 12/09/2015 0519   CREATININE 0.74 12/10/2015 0553   CREATININE 0.76 12/09/2015 1440   GLUCOSE 98 12/10/2015 0553   GLUCOSE 111* 12/09/2015 0519   CALCIUM 8.0* 12/10/2015 0553   CALCIUM 8.4* 12/09/2015 0519   AST 23 12/05/2015 0510   AST 28 12/04/2015 2030   ALT 16 12/05/2015 0510   ALT 19 12/04/2015 2030   ALKPHOS 48 12/05/2015 0510   ALKPHOS 55 12/04/2015 2030   BILITOT 0.7 12/05/2015 0510   BILITOT 0.9 12/04/2015 2030   PROT 5.4* 12/05/2015 0510   PROT 6.6 12/04/2015 2030   ALBUMIN 3.4* 12/05/2015 0510   ALBUMIN 4.2 12/04/2015 2030      Studies/Results: No results found.  Anti-infectives: Anti-infectives    None      Assessment  SBO POD#2 diagnostic laparoscopy, laparotomy, LOA---Dr. Dalbert Batman -continue with NGT, bowel rest -up to chair, mobilize, PT eval -IS Hypothyroidism-IV levothyroxine  Atrial fibrillation-appreciate cardiology assistance. Continue cardiac monitoring. Recommend low dose PO cardizem when able to POs as well as NOAC prior to DC.  FEN-NPO, gentle IVF. Tylenol IV, morphine VTE prophylaxis-SCD/lovenox Dispo-ileus-wait for return of bowel function    LOS: 6 days   Plan: Wait for return of bowel function.  Discussed her situation in detail with her granddaughter.   Tracy [name] J 12/11/2015

## 2015-12-11 NOTE — Progress Notes (Signed)
SUBJECTIVE:  No complaints of chest pain  OBJECTIVE:   Vitals:   Filed Vitals:   12/10/15 0651 12/10/15 1300 12/10/15 2105 12/11/15 0502  BP: 122/78 124/76 116/66 141/82  Pulse: 96 100 101 96  Temp: 98 F (36.7 C) 98.2 F (36.8 C) 97.8 F (36.6 C) 97.5 F (36.4 C)  TempSrc: Oral Oral Oral Oral  Resp: 18 18 17 18   Height:      Weight:      SpO2: 98% 98% 99% 97%   I&O's:   Intake/Output Summary (Last 24 hours) at 12/11/15 1342 Last data filed at 12/11/15 1317  Gross per 24 hour  Intake 3931.67 ml  Output   2200 ml  Net 1731.67 ml   TELEMETRY: Reviewed telemetry pt in atrial fibrillation with RVR:     PHYSICAL EXAM General: Well developed, well nourished, in no acute distress Head: Eyes PERRLA, No xanthomas.   Normal cephalic and atramatic  Lungs:   Few crackles at bases Heart:   Irregularly irregular and tachy S1 S2 Pulses are 2+ & equal. Abdomen: Bowel sounds are positive, abdomen soft and non-tender without masses  Extremities:   No clubbing, cyanosis or edema.  DP +1 Neuro: Alert and oriented X 3. Psych:  Good affect, responds appropriately   LABS: Basic Metabolic Panel:  Recent Labs  12/09/15 0519 12/09/15 1440 12/10/15 0553  NA 144  --  143  K 3.0*  --  3.9  CL 106  --  106  CO2 27  --  25  GLUCOSE 111*  --  98  BUN 10  --  9  CREATININE 0.78 0.76 0.74  CALCIUM 8.4*  --  8.0*  MG  --   --  1.7   Liver Function Tests: No results for input(s): AST, ALT, ALKPHOS, BILITOT, PROT, ALBUMIN in the last 72 hours. No results for input(s): LIPASE, AMYLASE in the last 72 hours. CBC:  Recent Labs  12/10/15 0553 12/11/15 0615  WBC 8.6 8.8  HGB 14.9 13.2  HCT 44.6 40.8  MCV 96.1 97.6  PLT 200 198   Cardiac Enzymes: No results for input(s): CKTOTAL, CKMB, CKMBINDEX, TROPONINI in the last 72 hours. BNP: Invalid input(s): POCBNP D-Dimer: No results for input(s): DDIMER in the last 72 hours. Hemoglobin A1C: No results for input(s): HGBA1C in the  last 72 hours. Fasting Lipid Panel: No results for input(s): CHOL, HDL, LDLCALC, TRIG, CHOLHDL, LDLDIRECT in the last 72 hours. Thyroid Function Tests: No results for input(s): TSH, T4TOTAL, T3FREE, THYROIDAB in the last 72 hours.  Invalid input(s): FREET3 Anemia Panel: No results for input(s): VITAMINB12, FOLATE, FERRITIN, TIBC, IRON, RETICCTPCT in the last 72 hours. Coag Panel:   No results found for: INR, PROTIME  RADIOLOGY: Ct Abdomen Pelvis W Contrast  12/04/2015  CLINICAL DATA:  75 year old female with lower abdominal pain and nausea. EXAM: CT ABDOMEN AND PELVIS WITH CONTRAST TECHNIQUE: Multidetector CT imaging of the abdomen and pelvis was performed using the standard protocol following bolus administration of intravenous contrast. CONTRAST:  121mL OMNIPAQUE IOHEXOL 300 MG/ML  SOLN COMPARISON:  CT dated 09/14/2008 FINDINGS: The visualized lung bases are clear. No intra-abdominal free air or free fluid. The liver, gallbladder, pancreas, spleen, adrenal glands appear unremarkable. Mild bilateral renal atrophy. There is no hydronephrosis on either side. The visualized ureters and urinary bladder appear unremarkable. The uterus appear grossly unremarkable. Multiple top-normal caliber fluid-filled loops of small bowel noted in the lower abdomen and pelvis. These measure up to 2.4 cm in  diameter. The distal and terminal ileum are collapsed. A focal area of narrowing in the small bowel in the right hemipelvis (series 201 image 56 and coronal series 203, image 57) noted likely related to underlying adhesions. There is a small probable metallic clip adjacent to this area likely from tubal ligation. Moderate stool noted throughout the colon. Appendectomy. The abdominal aorta and IVC appear unremarkable. No portal venous gas identified. There is no adenopathy. Small fat containing umbilical hernia. The abdominal wall soft tissues appear unremarkable. There is mild degenerative changes of spine. No acute  fracture. IMPRESSION: Multiple top-normal caliber fluid-filled loops of small bowel in the mid and lower abdomen with transition zone in the right hemipelvis. Findings most consistent with an early small-bowel obstruction likely related to adhesions. Clinical correlation and follow-up recommended. Electronically Signed   By: Anner Crete M.D.   On: 12/04/2015 23:48   Dg Abd 2 Views  12/07/2015  CLINICAL DATA:  75 year old female with nausea and vomiting with abdominal pain for 3 days. Small bowel obstruction suspected on recent CT Abdomen and Pelvis. Initial encounter. EXAM: ABDOMEN - 2 VIEW COMPARISON:  12/06/2015 and earlier. FINDINGS: Upright and supine views of the abdomen. Enteric tube remains in place, side hole the level of the gastric body. Minimally changed appearance of multiple mid abdominal air in fluid containing small bowel loops. Fluid levels persist on upright images. Paucity of colonic gas, but the large bowel appears largely decompressed as seen on the recent CT. Overall, the gas pattern has not significantly improved since 12/04/2015. No pneumoperitoneum. Stable lung bases. No acute osseous abnormality identified. IMPRESSION: 1. Enteric tube remains in place, side hole to level of the gastric body. 2. No significant improvement in bowel gas pattern since 12/04/2015, suggesting small bowel obstruction or small bowel ileus. 3. No free air. Electronically Signed   By: Genevie Ann M.D.   On: 12/07/2015 09:02   Dg Abd 2 Views  12/06/2015  CLINICAL DATA:  Abdominal pain, mild small bowel dilatation on recent CT examination. EXAM: ABDOMEN - 2 VIEW COMPARISON:  12/05/2015 FINDINGS: Nasogastric catheter is noted within the distal stomach. Few scattered loops of mildly dilated small bowel are noted. No free air is seen. No new obstructive changes are noted. IMPRESSION: Few loops of mildly dilated small bowel not significantly changed from previous exam. Electronically Signed   By: Inez Catalina M.D.    On: 12/06/2015 07:40   Dg Abd Portable 1v  12/08/2015  CLINICAL DATA:  Followup small bowel obstruction. EXAM: PORTABLE ABDOMEN - 1 VIEW COMPARISON:  12/07/2015 FINDINGS: Dilated loops of small bowel appear mildly increased in diameter when compared to the previous day's study. There is some stool and air in a nondilated colon. Nasal/orogastric tube is well positioned with its tip in the distal stomach. IMPRESSION: 1. Small bowel dilation appears increased in severity from the previous day's study. Findings consistent with a persistent partial small bowel obstruction. Electronically Signed   By: Lajean Manes M.D.   On: 12/08/2015 11:36   Dg Abd Portable 1v-small Bowel Obstruction Protocol-initial, 8 Hr Delay  12/05/2015  CLINICAL DATA:  Followup small bowel obstruction. EXAM: PORTABLE ABDOMEN - 1 VIEW COMPARISON:  12/05/2015 FINDINGS: Nasogastric tube is again seen with tip in proximal stomach. Oral contrast remains within the stomach. Multiple mildly dilated small bowel loops are again seen in the mid and lower abdomen, without significant change. No significant distal progression of oral contrast material is seen. The The colon remains nondilated. These findings  are suspicious for partial small bowel obstruction. IMPRESSION: Findings suspicious for partial small bowel obstruction. Electronically Signed   By: Earle Gell M.D.   On: 12/05/2015 21:05   Dg Abd Portable 1v-small Bowel Protocol-position Verification  12/05/2015  CLINICAL DATA:  Encounter for nasogastric tube placement EXAM: PORTABLE ABDOMEN - 1 VIEW COMPARISON:  Portable exam 0955 hours compared to CT abdomen and pelvis 12/04/2015 FINDINGS: Nasogastric tube coiled in proximal stomach. Normal bowel gas pattern. Few air-filled nondistended loops of small bowel in mid abdomen. Excreted contrast material within renal collecting systems and bladder. No bowel dilatation or bowel wall thickening. Bones appear demineralized. IMPRESSION: Nasogastric tube  coiled in proximal stomach. Electronically Signed   By: Lavonia Dana M.D.   On: 12/05/2015 10:23   Assessment/Plan:   Afib: Unknown duration. CHADVASC 2 but will be 3 for age on 3/28. Would start anticoagulation ? NOAC on hospital d/c or when ok with surgery and taking PO. Currently NPO with NG tube in place. HR in the 140-150's at times.  Will start Lopressor 5mg  q6 hours and titrate as needed for HR control.     SBO Still tending NG tube draining suspect it will stay in another 24 hrs   Sueanne Margarita, MD  12/11/2015  1:42 PM

## 2015-12-12 LAB — CBC
HCT: 40.2 % (ref 36.0–46.0)
Hemoglobin: 12.9 g/dL (ref 12.0–15.0)
MCH: 31.4 pg (ref 26.0–34.0)
MCHC: 32.1 g/dL (ref 30.0–36.0)
MCV: 97.8 fL (ref 78.0–100.0)
Platelets: 253 10*3/uL (ref 150–400)
RBC: 4.11 MIL/uL (ref 3.87–5.11)
RDW: 12.9 % (ref 11.5–15.5)
WBC: 9.8 10*3/uL (ref 4.0–10.5)

## 2015-12-12 LAB — HEPARIN LEVEL (UNFRACTIONATED): Heparin Unfractionated: 0.58 IU/mL (ref 0.30–0.70)

## 2015-12-12 NOTE — Progress Notes (Signed)
SUBJECTIVE:  No complaints  OBJECTIVE:   Vitals:   Filed Vitals:   12/11/15 0502 12/11/15 1347 12/11/15 2210 12/12/15 0502  BP: 141/82 121/60 138/81 136/78  Pulse: 96 100 98 98  Temp: 97.5 F (36.4 C) 97.6 F (36.4 C) 97.6 F (36.4 C) 97.4 F (36.3 C)  TempSrc: Oral Oral Oral Oral  Resp: 18 18 17 17   Height:      Weight:      SpO2: 97% 95% 94% 94%   I&O's:   Intake/Output Summary (Last 24 hours) at 12/12/15 1045 Last data filed at 12/12/15 0941  Gross per 24 hour  Intake 5604.7 ml  Output   2600 ml  Net 3004.7 ml   TELEMETRY: Reviewed telemetry pt in atrial fibrillation with CVR     PHYSICAL EXAM General: Well developed, well nourished, in no acute distress Head: Eyes PERRLA, No xanthomas.   Normal cephalic and atramatic  Lungs:   Clear bilaterally to auscultation anteriorly Heart:   Irregularly irregular S1 S2 Pulses are 2+ & equal. Abdomen: Bowel sounds are positive, abdomen soft and non-tender without masses  Extremities:   No clubbing, cyanosis or edema.  DP +1 Neuro: Alert and oriented X 3. Psych:  Good affect, responds appropriately   LABS: Basic Metabolic Panel:  Recent Labs  12/09/15 1440 12/10/15 0553  NA  --  143  K  --  3.9  CL  --  106  CO2  --  25  GLUCOSE  --  98  BUN  --  9  CREATININE 0.76 0.74  CALCIUM  --  8.0*  MG  --  1.7   Liver Function Tests: No results for input(s): AST, ALT, ALKPHOS, BILITOT, PROT, ALBUMIN in the last 72 hours. No results for input(s): LIPASE, AMYLASE in the last 72 hours. CBC:  Recent Labs  12/11/15 0615 12/12/15 0455  WBC 8.8 9.8  HGB 13.2 12.9  HCT 40.8 40.2  MCV 97.6 97.8  PLT 198 253   Cardiac Enzymes: No results for input(s): CKTOTAL, CKMB, CKMBINDEX, TROPONINI in the last 72 hours. BNP: Invalid input(s): POCBNP D-Dimer: No results for input(s): DDIMER in the last 72 hours. Hemoglobin A1C: No results for input(s): HGBA1C in the last 72 hours. Fasting Lipid Panel: No results for  input(s): CHOL, HDL, LDLCALC, TRIG, CHOLHDL, LDLDIRECT in the last 72 hours. Thyroid Function Tests: No results for input(s): TSH, T4TOTAL, T3FREE, THYROIDAB in the last 72 hours.  Invalid input(s): FREET3 Anemia Panel: No results for input(s): VITAMINB12, FOLATE, FERRITIN, TIBC, IRON, RETICCTPCT in the last 72 hours. Coag Panel:   No results found for: INR, PROTIME  RADIOLOGY: Ct Abdomen Pelvis W Contrast  12/04/2015  CLINICAL DATA:  75 year old female with lower abdominal pain and nausea. EXAM: CT ABDOMEN AND PELVIS WITH CONTRAST TECHNIQUE: Multidetector CT imaging of the abdomen and pelvis was performed using the standard protocol following bolus administration of intravenous contrast. CONTRAST:  122mL OMNIPAQUE IOHEXOL 300 MG/ML  SOLN COMPARISON:  CT dated 09/14/2008 FINDINGS: The visualized lung bases are clear. No intra-abdominal free air or free fluid. The liver, gallbladder, pancreas, spleen, adrenal glands appear unremarkable. Mild bilateral renal atrophy. There is no hydronephrosis on either side. The visualized ureters and urinary bladder appear unremarkable. The uterus appear grossly unremarkable. Multiple top-normal caliber fluid-filled loops of small bowel noted in the lower abdomen and pelvis. These measure up to 2.4 cm in diameter. The distal and terminal ileum are collapsed. A focal area of narrowing in the small bowel  in the right hemipelvis (series 201 image 56 and coronal series 203, image 57) noted likely related to underlying adhesions. There is a small probable metallic clip adjacent to this area likely from tubal ligation. Moderate stool noted throughout the colon. Appendectomy. The abdominal aorta and IVC appear unremarkable. No portal venous gas identified. There is no adenopathy. Small fat containing umbilical hernia. The abdominal wall soft tissues appear unremarkable. There is mild degenerative changes of spine. No acute fracture. IMPRESSION: Multiple top-normal caliber  fluid-filled loops of small bowel in the mid and lower abdomen with transition zone in the right hemipelvis. Findings most consistent with an early small-bowel obstruction likely related to adhesions. Clinical correlation and follow-up recommended. Electronically Signed   By: Anner Crete M.D.   On: 12/04/2015 23:48   Dg Abd 2 Views  12/07/2015  CLINICAL DATA:  75 year old female with nausea and vomiting with abdominal pain for 3 days. Small bowel obstruction suspected on recent CT Abdomen and Pelvis. Initial encounter. EXAM: ABDOMEN - 2 VIEW COMPARISON:  12/06/2015 and earlier. FINDINGS: Upright and supine views of the abdomen. Enteric tube remains in place, side hole the level of the gastric body. Minimally changed appearance of multiple mid abdominal air in fluid containing small bowel loops. Fluid levels persist on upright images. Paucity of colonic gas, but the large bowel appears largely decompressed as seen on the recent CT. Overall, the gas pattern has not significantly improved since 12/04/2015. No pneumoperitoneum. Stable lung bases. No acute osseous abnormality identified. IMPRESSION: 1. Enteric tube remains in place, side hole to level of the gastric body. 2. No significant improvement in bowel gas pattern since 12/04/2015, suggesting small bowel obstruction or small bowel ileus. 3. No free air. Electronically Signed   By: Genevie Ann M.D.   On: 12/07/2015 09:02   Dg Abd 2 Views  12/06/2015  CLINICAL DATA:  Abdominal pain, mild small bowel dilatation on recent CT examination. EXAM: ABDOMEN - 2 VIEW COMPARISON:  12/05/2015 FINDINGS: Nasogastric catheter is noted within the distal stomach. Few scattered loops of mildly dilated small bowel are noted. No free air is seen. No new obstructive changes are noted. IMPRESSION: Few loops of mildly dilated small bowel not significantly changed from previous exam. Electronically Signed   By: Inez Catalina M.D.   On: 12/06/2015 07:40   Dg Abd Portable  1v  12/08/2015  CLINICAL DATA:  Followup small bowel obstruction. EXAM: PORTABLE ABDOMEN - 1 VIEW COMPARISON:  12/07/2015 FINDINGS: Dilated loops of small bowel appear mildly increased in diameter when compared to the previous day's study. There is some stool and air in a nondilated colon. Nasal/orogastric tube is well positioned with its tip in the distal stomach. IMPRESSION: 1. Small bowel dilation appears increased in severity from the previous day's study. Findings consistent with a persistent partial small bowel obstruction. Electronically Signed   By: Lajean Manes M.D.   On: 12/08/2015 11:36   Dg Abd Portable 1v-small Bowel Obstruction Protocol-initial, 8 Hr Delay  12/05/2015  CLINICAL DATA:  Followup small bowel obstruction. EXAM: PORTABLE ABDOMEN - 1 VIEW COMPARISON:  12/05/2015 FINDINGS: Nasogastric tube is again seen with tip in proximal stomach. Oral contrast remains within the stomach. Multiple mildly dilated small bowel loops are again seen in the mid and lower abdomen, without significant change. No significant distal progression of oral contrast material is seen. The The colon remains nondilated. These findings are suspicious for partial small bowel obstruction. IMPRESSION: Findings suspicious for partial small bowel obstruction. Electronically Signed  By: Earle Gell M.D.   On: 12/05/2015 21:05   Dg Abd Portable 1v-small Bowel Protocol-position Verification  12/05/2015  CLINICAL DATA:  Encounter for nasogastric tube placement EXAM: PORTABLE ABDOMEN - 1 VIEW COMPARISON:  Portable exam 0955 hours compared to CT abdomen and pelvis 12/04/2015 FINDINGS: Nasogastric tube coiled in proximal stomach. Normal bowel gas pattern. Few air-filled nondistended loops of small bowel in mid abdomen. Excreted contrast material within renal collecting systems and bladder. No bowel dilatation or bowel wall thickening. Bones appear demineralized. IMPRESSION: Nasogastric tube coiled in proximal stomach.  Electronically Signed   By: Lavonia Dana M.D.   On: 12/05/2015 10:23    Assessment/Plan:   Afib: Unknown duration. CHADVASC 2 but will be 3 for age on 3/28. Would start anticoagulation ? NOAC on hospital d/c or when ok with surgery and taking PO. Currently NPO with NG tube in place. HR in the 90 's. Will continue Lopressor 5mg  q6 hours and titrate as needed for HR control. 2D echo pending.  SBO Still tending NG tube draining suspect it will stay in another 24 hrs   Tracy Margarita, MD  12/12/2015  10:45 AM

## 2015-12-12 NOTE — Progress Notes (Signed)
3 Days Post-Op  Subjective: Passing gas.  Objective: Vital signs in last 24 hours: Temp:  [97.4 F (36.3 C)-97.6 F (36.4 C)] 97.4 F (36.3 C) (03/12 0502) Pulse Rate:  [98-100] 98 (03/12 0502) Resp:  [17-18] 17 (03/12 0502) BP: (121-138)/(60-81) 136/78 mmHg (03/12 0502) SpO2:  [94 %-95 %] 94 % (03/12 0502) Last BM Date: 12/04/15  Intake/Output from previous day: 03/11 0701 - 03/12 0700 In: 5604.7 [P.O.:230; I.V.:5074.7; IV Piggyback:300] Out: 2600 [Urine:2000; Emesis/NG output:600] Intake/Output this shift: Total I/O In: -  Out: 300 [Urine:300]  PE: General- In NAD Abdomen-soft, incisions clean and intact, active bowel sounds  Lab Results:   Recent Labs  12/11/15 0615 12/12/15 0455  WBC 8.8 9.8  HGB 13.2 12.9  HCT 40.8 40.2  PLT 198 253   BMET  Recent Labs  12/09/15 1440 12/10/15 0553  NA  --  143  K  --  3.9  CL  --  106  CO2  --  25  GLUCOSE  --  98  BUN  --  9  CREATININE 0.76 0.74  CALCIUM  --  8.0*   PT/INR No results for input(s): LABPROT, INR in the last 72 hours. Comprehensive Metabolic Panel:    Component Value Date/Time   NA 143 12/10/2015 0553   NA 144 12/09/2015 0519   K 3.9 12/10/2015 0553   K 3.0* 12/09/2015 0519   CL 106 12/10/2015 0553   CL 106 12/09/2015 0519   CO2 25 12/10/2015 0553   CO2 27 12/09/2015 0519   BUN 9 12/10/2015 0553   BUN 10 12/09/2015 0519   CREATININE 0.74 12/10/2015 0553   CREATININE 0.76 12/09/2015 1440   GLUCOSE 98 12/10/2015 0553   GLUCOSE 111* 12/09/2015 0519   CALCIUM 8.0* 12/10/2015 0553   CALCIUM 8.4* 12/09/2015 0519   AST 23 12/05/2015 0510   AST 28 12/04/2015 2030   ALT 16 12/05/2015 0510   ALT 19 12/04/2015 2030   ALKPHOS 48 12/05/2015 0510   ALKPHOS 55 12/04/2015 2030   BILITOT 0.7 12/05/2015 0510   BILITOT 0.9 12/04/2015 2030   PROT 5.4* 12/05/2015 0510   PROT 6.6 12/04/2015 2030   ALBUMIN 3.4* 12/05/2015 0510   ALBUMIN 4.2 12/04/2015 2030     Studies/Results: No results  found.  Anti-infectives: Anti-infectives    None      Assessment  SBO POD#3 diagnostic laparoscopy, laparotomy, LOA---Dr. Dalbert Batman - ileus->bowel function starting to return Hypothyroidism-IV levothyroxine  Atrial fibrillation-per Cardiology.  Recommend low dose PO cardizem when able to POs as well as NOAC prior to DC.  VTE prophylaxis-SCD/lovenox     LOS: 7 days   Plan: Clamp ng tube.  Clear liquid diet.   Tracy [name] 12/12/2015

## 2015-12-12 NOTE — Progress Notes (Signed)
ANTICOAGULATION CONSULT NOTE - Follow Up Consult  Pharmacy Consult for heparin Indication: atrial fibrillation  Allergies  Allergen Reactions  . Adhesive [Tape]     Surgical tape  . Avelox [Moxifloxacin Hcl In Nacl] Other (See Comments)    N/V/rash/swelling  . Tussionex Pennkinetic Er [Hydrocod Polst-Cpm Polst Er] Itching and Nausea And Vomiting  . Codeine Itching, Nausea And Vomiting and Rash    ALL FORMS OF CODEINE  . Penicillins Itching, Nausea And Vomiting, Swelling and Rash    ALL FORMS OF PENICILLINS Has patient had a PCN reaction causing immediate rash, facial/tongue/throat swelling, SOB or lightheadedness with hypotension: Yes Has patient had a PCN reaction causing severe rash involving mucus membranes or skin necrosis: No Has patient had a PCN reaction that required hospitalization No Has patient had a PCN reaction occurring within the last 10 years: No If all of the above answers are "NO", then may proceed with Cephalosporin use.    Patient Measurements: Height: 5\' 4"  (162.6 cm) Weight: 160 lb 4.8 oz (72.712 kg) IBW/kg (Calculated) : 54.7 Heparin Dosing Weight:   Vital Signs: Temp: 97.4 F (36.3 C) (03/12 0502) Temp Source: Oral (03/12 0502) BP: 136/78 mmHg (03/12 0502) Pulse Rate: 98 (03/12 0502)  Labs:  Recent Labs  12/09/15 1440 12/10/15 0553 12/10/15 2230 12/11/15 0615 12/12/15 0455  HGB 14.8 14.9  --  13.2 12.9  HCT 43.9 44.6  --  40.8 40.2  PLT 197 200  --  198 253  HEPARINUNFRC  --   --  0.66 0.56 0.58  CREATININE 0.76 0.74  --   --   --     Estimated Creatinine Clearance: 60.3 mL/min (by C-G formula based on Cr of 0.74).   Medications:  Scheduled:  . antiseptic oral rinse  7 mL Mouth Rinse BID  . famotidine (PEPCID) IV  20 mg Intravenous Q12H  . levothyroxine  37.5 mcg Intravenous Daily  . metoprolol  5 mg Intravenous 4 times per day  . pantoprazole (PROTONIX) IV  40 mg Intravenous QHS    Assessment: 75yo female on heparin for AFib,  with therapeutic heparin level this AM though aiming for lower end of goal range due to surgery on 3/9.  Hg and pltc wnl, stable.  No bleeding noted per RN  Goal of Therapy:  Heparin level 0.3-0.7 units/ml Monitor platelets by anticoagulation protocol: Yes   Plan:  Decrease heparin to 850 units/hr Watch for s/s bleeding F/U AM labs F/U plans for oral [facility]  Albertina Parr, PharmD., BCPS Clinical Pharmacist Pager [phone]

## 2015-12-13 ENCOUNTER — Encounter (HOSPITAL_COMMUNITY): Payer: Self-pay | Admitting: Cardiology

## 2015-12-13 DIAGNOSIS — I4891 Unspecified atrial fibrillation: Secondary | ICD-10-CM

## 2015-12-13 LAB — CBC
HEMATOCRIT: 37.3 % (ref 36.0–46.0)
Hemoglobin: 11.7 g/dL — ABNORMAL LOW (ref 12.0–15.0)
MCH: 30.8 pg (ref 26.0–34.0)
MCHC: 31.4 g/dL (ref 30.0–36.0)
MCV: 98.2 fL (ref 78.0–100.0)
Platelets: 255 10*3/uL (ref 150–400)
RBC: 3.8 MIL/uL — AB (ref 3.87–5.11)
RDW: 12.9 % (ref 11.5–15.5)
WBC: 8 10*3/uL (ref 4.0–10.5)

## 2015-12-13 LAB — HEPARIN LEVEL (UNFRACTIONATED): HEPARIN UNFRACTIONATED: 0.37 [IU]/mL (ref 0.30–0.70)

## 2015-12-13 MED ORDER — LEVOTHYROXINE SODIUM 75 MCG PO TABS
75.0000 ug | ORAL_TABLET | Freq: Every day | ORAL | Status: DC
  Administered 2015-12-14 – 2015-12-16 (×3): 75 ug via ORAL
  Filled 2015-12-13 (×3): qty 1

## 2015-12-13 MED ORDER — BOOST / RESOURCE BREEZE PO LIQD
1.0000 | Freq: Three times a day (TID) | ORAL | Status: DC
  Administered 2015-12-13 – 2015-12-15 (×4): 1 via ORAL

## 2015-12-13 MED ORDER — ACETAMINOPHEN 325 MG PO TABS: 325.0000 mg | ORAL_TABLET | Freq: Four times a day (QID) | ORAL | Status: DC | PRN

## 2015-12-13 MED ORDER — OXYCODONE-ACETAMINOPHEN 5-325 MG PO TABS
1.0000 | ORAL_TABLET | ORAL | Status: DC | PRN
  Administered 2015-12-14 – 2015-12-15 (×2): 2 via ORAL
  Filled 2015-12-13 (×2): qty 2

## 2015-12-13 NOTE — Progress Notes (Signed)
ANTICOAGULATION CONSULT NOTE - Follow Up Consult  Pharmacy Consult for heparin Indication: atrial fibrillation  Allergies  Allergen Reactions  . Adhesive [Tape]     Surgical tape  . Avelox [Moxifloxacin Hcl In Nacl] Other (See Comments)    N/V/rash/swelling  . Tussionex Pennkinetic Er [Hydrocod Polst-Cpm Polst Er] Itching and Nausea And Vomiting  . Codeine Itching, Nausea And Vomiting and Rash    ALL FORMS OF CODEINE  . Penicillins Itching, Nausea And Vomiting, Swelling and Rash    ALL FORMS OF PENICILLINS Has patient had a PCN reaction causing immediate rash, facial/tongue/throat swelling, SOB or lightheadedness with hypotension: Yes Has patient had a PCN reaction causing severe rash involving mucus membranes or skin necrosis: No Has patient had a PCN reaction that required hospitalization No Has patient had a PCN reaction occurring within the last 10 years: No If all of the above answers are "NO", then may proceed with Cephalosporin use.    Patient Measurements: Height: 5\' 4"  (162.6 cm) Weight: 160 lb 4.8 oz (72.712 kg) IBW/kg (Calculated) : 54.7 Heparin Dosing Weight:   Vital Signs: Temp: 97.4 F (36.3 C) (03/13 0613) Temp Source: Oral (03/13 [id number]) BP: 129/57 mmHg (03/13 0613) Pulse Rate: 113 (03/13 0613)  Labs:  Recent Labs  12/11/15 0615 12/12/15 0455 12/13/15 0639 12/13/15 0640  HGB 13.2 12.9  --  11.7*  HCT 40.8 40.2  --  37.3  PLT 198 253  --  255  HEPARINUNFRC 0.56 0.58 0.37  --     Estimated Creatinine Clearance: 60.3 mL/min (by C-G formula based on Cr of 0.74).  Assessment: 75yo female on heparin for AFib, with therapeutic heparin level this AM. Hg and pltc wnl, stable.  No bleeding noted.  Goal of Therapy:  Heparin level 0.3-0.7 units/ml Monitor platelets by anticoagulation protocol: Yes   Plan:  Continue heparin at 850 units/hr Watch for s/s bleeding F/U plans for oral [facility]  Sherlon Handing, PharmD, BCPS Clinical pharmacist, pager  [phone] 12/13/2015 7:35 AM

## 2015-12-13 NOTE — Progress Notes (Signed)
At 2230 pt. C/o nausea and bloating. NGT connected to LWIS, and immediately drained 150 cc bile, pt. voiced relief. NGT clamped after 3hrs. for total of 300cc output.

## 2015-12-13 NOTE — Progress Notes (Signed)
    Subjective:  Up in chair, still has NG in.  Objective:  Vital Signs in the last 24 hours: Temp:  [97.4 F (36.3 C)-98.2 F (36.8 C)] 97.4 F (36.3 C) (03/13 0613) Pulse Rate:  [90-113] 113 (03/13 0613) Resp:  [18] 18 (03/13 0613) BP: (121-148)/(57-72) 129/57 mmHg (03/13 0613) SpO2:  [93 %-99 %] 99 % (03/13 0613)  Intake/Output from previous day:  Intake/Output Summary (Last 24 hours) at 12/13/15 1310 Last data filed at 12/13/15 1132  Gross per 24 hour  Intake   1519 ml  Output   2100 ml  Net   -581 ml    Physical Exam: General appearance: alert, cooperative and no distress Lungs: decreased breath sounds Rt base Heart: irregularly irregular rhythm Extremities: no edema   Rate: 90-120  Rhythm: atrial fibrillation  Lab Results:  Recent Labs  12/12/15 0455 12/13/15 0640  WBC 9.8 8.0  HGB 12.9 11.7*  PLT 253 255   No results for input(s): NA, K, CL, CO2, GLUCOSE, BUN, CREATININE in the last 72 hours. No results for input(s): TROPONINI in the last 72 hours.  Invalid input(s): CK, MB No results for input(s): INR in the last 72 hours.  Scheduled Meds: . antiseptic oral rinse  7 mL Mouth Rinse BID  . famotidine (PEPCID) IV  20 mg Intravenous Q12H  . feeding supplement  1 Container Oral TID BM  . levothyroxine  75 mcg Oral Q breakfast  . metoprolol  5 mg Intravenous 4 times per day  . pantoprazole (PROTONIX) IV  40 mg Intravenous QHS   Continuous Infusions: . 0.9 % NaCl with KCl 20 mEq / L 50 mL/hr at 12/13/15 1146  . heparin 850 Units/hr (12/13/15 0655)  . lactated ringers     PRN Meds:.acetaminophen, albuterol, menthol-cetylpyridinium, metoprolol, morphine injection, ondansetron **OR** ondansetron (ZOFRAN) IV, oxyCODONE-acetaminophen, phenol   Imaging: Imaging results have been reviewed   Assessment/Plan:  75 y.o. admitted 12/05/15 with SBO. She underwent exploratory laparoscopy and lysis of adhesions 12/09/15. We were called for AF with RVR. Review of  her EKGs show that she was in AF with CVR on admission 12/05/15. She is totally asymptomatic and unaware of AF.   Principal Problem:   Small bowel obstruction-surgery 12/09/15 Active Problems:   Atrial fibrillation, new onset (HCC)   Hypothyroidism-(TSH WNL)   PLAN: Echo pending. She is on IV Heparin and Lopressor. Start NOAC or Coumadin and po Lopressor when OK with surgery. We will follow.   Kerin Ransom PA-C 12/13/2015, 1:10 PM [phone]  Attending Note:   The patient was seen and examined.  Agree with assessment and plan as noted above.  Changes made to the above note as needed.  HR is controlled.  She is completely asymptomatic   Continue with rate control At present she still has an NG tube and the contents appears to possibly have some blood Will start NOAC once she is stable from a surgical standpoint    Thayer Headings, Brooke Bonito., MD, [facility] 12/13/2015, 2:34 PM [address]  [place] Pager [phone]

## 2015-12-13 NOTE — Progress Notes (Signed)
Initial Nutrition Assessment  DOCUMENTATION CODES:   Not applicable  INTERVENTION:   -Boost Breeze po TID, each supplement provides 250 kcal and 9 grams of protein -If prolonged NPO/clear liquid status is anticipated, consider initiation of nutrition support  NUTRITION DIAGNOSIS:   Inadequate oral intake related to altered GI function as evidenced by  (NPO/clear liquids x 8 days).  GOAL:   Patient will meet greater than or equal to 90% of their needs  MONITOR:   Diet advancement, Supplement acceptance, PO intake, Labs, Weight trends, Skin, I & O's  REASON FOR ASSESSMENT:   NPO/Clear Liquid Diet    ASSESSMENT:   Tracy [name] is a 75 year old female with a past medical history significant for hypothyroidism, mitral valve prolapse, status post appendectomy, and bilateral tubal ligation; who presents with complaints of of generalized abdominal pain with nausea and vomiting symptoms over the last day.  Pt admitted with SBO.   S/p Procedure(s) on 12/09/15: EXPLORATORY LAPAROTOMY, LYSIS OF ADHESIONS (N/A)   Pt was being cleaned by nurse tech at time of visit. Unable to complete Nutrition-Focused physical exam at this time.   Per chart review, pt poorly tolerated clamping trial on 12/07/15 and NGT was placed back to suction. Pt remains with NGT; noted about 400 ml output within the past 24 hours per doc flowsheet records.   Case discussed with RN. Per MD notes, NGT now clamped with plans to d/c NGT today. RN reports that pt is tolerating clear liquid diet poorly; she wants to eat but unable to take in much. Pt consumed approximately 50% of her jello and few sips of broth this AM. Pt has been NPO/clear liquids for the past 8 days and has not received adequate nutrition during the hospitalization.  Wt hx reviewed, which reveals wt stability.   Labs reviewed.   Diet Order:  Diet clear liquid Room service appropriate?: Yes; Fluid consistency:: Thin  Skin:  Reviewed, no issues  Last  BM:  12/04/15  Height:   Ht Readings from Last 1 Encounters:  12/05/15 5\' 4"  (1.626 m)    Weight:   Wt Readings from Last 1 Encounters:  12/05/15 160 lb 4.8 oz (72.712 kg)    Ideal Body Weight:  54.5 kg  BMI:  Body mass index is 27.5 kg/(m^2).  Estimated Nutritional Needs:   Kcal:  1550-1750  Protein:  85-100 grams  Fluid:  1.5-1.7 L  EDUCATION NEEDS:   No education needs identified at this time  Tracy [name], RD, LDN, CDE Pager: [phone] After hours Pager: [phone]

## 2015-12-13 NOTE — Progress Notes (Signed)
Patient ID: Tracy [name], female   DOB: [birth year], 75 y.o.   MRN: [id number]     [facility]      [address] [place], [place] [id number]    Phone: [phone] FAX: [phone]     Subjective: No n/v.  Passing flatus. No BM.  Tolerating clears.  Afebrile.  VSS.  Ambulating in hallways with assist.   Objective:  Vital signs:  Filed Vitals:   12/12/15 1334 12/12/15 2136 12/13/15 0415 12/13/15 0613  BP: 138/72 121/62 148/69 129/57  Pulse: 90 92 100 113  Temp: 97.6 F (36.4 C) 98.2 F (36.8 C) 97.5 F (36.4 C) 97.4 F (36.3 C)  TempSrc: Oral Oral Oral Oral  Resp: 18 18 18 18   Height:      Weight:      SpO2: 94% 93% 98% 99%    Last BM Date: 12/04/15  Intake/Output   Yesterday:  03/12 0701 - 03/13 0700 In: 54 [P.O.:540; I.V.:919] Out: 2500 [Urine:2100; Emesis/NG output:400] This shift:  Total I/O In: 120 [P.O.:120] Out: 150 [Urine:150]   Physical Exam: General: Pt awake/alert/oriented x4 in no acute distress Chest: cta. No chest wall pain w good excursion CV:  s1s2 irregularly irregular MS: Normal AROM mjr joints.  No obvious deformity Abdomen: Soft.  Nondistended.   Mildly tender at incisions only.  Incision without erythema, staples in place with approximated wound edges. No evidence of peritonitis.  No incarcerated hernias. Ext:  SCDs BLE.  No mjr edema.  No cyanosis Skin: No petechiae / purpura   Problem List:   Principal Problem:   Small bowel obstruction (HCC) Active Problems:   Hypothyroidism   Generalized abdominal pain   Nausea and vomiting   Atrial fibrillation (HCC)   SBO (small bowel obstruction) (HCC)    Results:   Labs: Results for orders placed or performed during the hospital encounter of 12/04/15 (from the past 48 hour(s))  Heparin level (unfractionated)     Status: None   Collection Time: 12/12/15  4:55 AM  Result Value Ref Range   Heparin Unfractionated 0.58 0.30 - 0.70 IU/mL     Comment:        IF HEPARIN RESULTS ARE BELOW EXPECTED VALUES, AND PATIENT DOSAGE HAS BEEN CONFIRMED, SUGGEST FOLLOW UP TESTING OF ANTITHROMBIN III LEVELS.   CBC     Status: None   Collection Time: 12/12/15  4:55 AM  Result Value Ref Range   WBC 9.8 4.0 - 10.5 K/uL   RBC 4.11 3.87 - 5.11 MIL/uL   Hemoglobin 12.9 12.0 - 15.0 g/dL   HCT 40.2 36.0 - 46.0 %   MCV 97.8 78.0 - 100.0 fL   MCH 31.4 26.0 - 34.0 pg   MCHC 32.1 30.0 - 36.0 g/dL   RDW 12.9 11.5 - 15.5 %   Platelets 253 150 - 400 K/uL  Heparin level (unfractionated)     Status: None   Collection Time: 12/13/15  6:39 AM  Result Value Ref Range   Heparin Unfractionated 0.37 0.30 - 0.70 IU/mL    Comment:        IF HEPARIN RESULTS ARE BELOW EXPECTED VALUES, AND PATIENT DOSAGE HAS BEEN CONFIRMED, SUGGEST FOLLOW UP TESTING OF ANTITHROMBIN III LEVELS.   CBC     Status: Abnormal   Collection Time: 12/13/15  6:40 AM  Result Value Ref Range   WBC 8.0 4.0 - 10.5 K/uL   RBC 3.80 (L) 3.87 - 5.11 MIL/uL   Hemoglobin  11.7 (L) 12.0 - 15.0 g/dL   HCT 37.3 36.0 - 46.0 %   MCV 98.2 78.0 - 100.0 fL   MCH 30.8 26.0 - 34.0 pg   MCHC 31.4 30.0 - 36.0 g/dL   RDW 12.9 11.5 - 15.5 %   Platelets 255 150 - 400 K/uL    Imaging / Studies: No results found.  Medications / Allergies:  Scheduled Meds: . antiseptic oral rinse  7 mL Mouth Rinse BID  . famotidine (PEPCID) IV  20 mg Intravenous Q12H  . levothyroxine  37.5 mcg Intravenous Daily  . metoprolol  5 mg Intravenous 4 times per day  . pantoprazole (PROTONIX) IV  40 mg Intravenous QHS   Continuous Infusions: . 0.9 % NaCl with KCl 20 mEq / L 75 mL/hr at 12/13/15 0656  . heparin 850 Units/hr (12/13/15 0655)  . lactated ringers     PRN Meds:.acetaminophen, albuterol, menthol-cetylpyridinium, metoprolol, morphine injection, ondansetron **OR** ondansetron (ZOFRAN) IV, oxyCODONE-acetaminophen, phenol  Antibiotics: Anti-infectives    None         Assessment/Plan SBO POD#4  diagnostic laparoscopy, laparotomy, LOA---Dr. Dalbert Batman -DC NGT and continue with clears -up to chair, mobilize, PT eval -IS Hypothyroidism-change to home  dose Atrial fibrillation-appreciate cardiology assistance. Continue cardiac monitoring. meds per cardiology.  On heparin gtt FEN-reduce IVF.  VTE prophylaxis-SCD, heparin gtt Dispo-anticipate DC 2-3 days   Erby Pian, [facility] Surgery Pager [phone]) For consults and floor pages call [phone]([id number])  12/13/2015 10:25 AM

## 2015-12-13 NOTE — Progress Notes (Signed)
Pt a/o, no c/o pain, pt remains on hep gtt at 8.5 ml/hr, and NS with 20 mEq K+ @ 50 ml/hr, pt has order to remove NGT however, overnight pt c/o fullness and NGT put to suction for about 4 hours and put out 300 cc, pt also stated "after a few sips of clears I get full" Emina Riebock NP paged regarding order to remove NGT and stated to leave it in clamped, pt oob amb well, VSS, pt stable

## 2015-12-13 NOTE — Care Management Important Message (Signed)
Important Message  Patient Details  Name: Tracy [name] MRN: [id number] Date of Birth: Aug 29, 1941   Medicare Important Message Given:  Other (see comment) (Not yet needed per NCM)    [name] Dura Mccormack 12/13/2015, 12:39 PM

## 2015-12-13 NOTE — Progress Notes (Signed)
Physical Therapy Treatment/ Discharge Patient Details Name: Tracy [name] MRN: [id number] DOB: 06/21/1941 Today's Date: 12/13/2015    History of Present Illness Patient is a 74 yo female  who is POD diagnostic laparoscopy, laparotomy. PMHx: hypothyroidism, mitral valve prolapse    PT Comments    Pt moving well ambulated unit earlier today with niece and able to ambulate unit again an additional 2x during session. Pt reports no difficulty with transfers and will have assist of family intermittently at D/C. Pt reports she is back to near baseline just moving slowly and agreeable to no further therapy needs at this time as she has met all goals. Will sign off and continue to recommend daily ambulation with nursing staff.   Follow Up Recommendations        Equipment Recommendations  None recommended by PT    Recommendations for Other Services       Precautions / Restrictions Precautions Precaution Comments: NGT    Mobility  Bed Mobility               General bed mobility comments: standing on arrival  Transfers Overall transfer level: Modified independent                  Ambulation/Gait Ambulation/Gait assistance: Independent Ambulation Distance (Feet): 1000 Feet Assistive device: None Gait Pattern/deviations: Step-through pattern;Decreased stride length   Gait velocity interpretation: Below normal speed for age/gender General Gait Details: decreased speed with pt able to maintain upright posture once tele box removed from chest   Stairs Stairs: Yes Stairs assistance: Modified independent (Device/Increase time) Stair Management: One rail Left;Alternating pattern;Forwards Number of Stairs: 4    Wheelchair Mobility    Modified Rankin (Stroke Patients Only)       Balance                                    Cognition Arousal/Alertness: Awake/alert Behavior During Therapy: WFL for tasks assessed/performed Overall Cognitive Status:  Within Functional Limits for tasks assessed                      Exercises      General Comments        Pertinent Vitals/Pain Pain Assessment: 0-10 Pain Score: 5  Pain Location: abdomen and throat Pain Descriptors / Indicators: Sore Pain Intervention(s): Limited activity within patient's tolerance;Repositioned    Home Living                      Prior Function            PT Goals (current goals can now be found in the care plan section) Progress towards PT goals: Goals met/education completed, patient discharged from PT    Frequency       PT Plan Current plan remains appropriate    Co-evaluation             End of Session   Activity Tolerance: Patient tolerated treatment well Patient left: in chair;with call bell/phone within reach;with family/visitor present     Time: 1313-1335 PT Time Calculation (min) (ACUTE ONLY): 22 min  Charges:  $Gait Training: 8-22 mins                    G Codes:      Tabor,  Beth 12/13/2015, 1:43 PM  [place] , PT [phone]   

## 2015-12-14 ENCOUNTER — Other Ambulatory Visit (HOSPITAL_COMMUNITY): Payer: Medicare Other

## 2015-12-14 LAB — CBC
HCT: 38.4 % (ref 36.0–46.0)
HEMOGLOBIN: 12.3 g/dL (ref 12.0–15.0)
MCH: 30.8 pg (ref 26.0–34.0)
MCHC: 32 g/dL (ref 30.0–36.0)
MCV: 96.2 fL (ref 78.0–100.0)
PLATELETS: 265 10*3/uL (ref 150–400)
RBC: 3.99 MIL/uL (ref 3.87–5.11)
RDW: 12.6 % (ref 11.5–15.5)
WBC: 7.7 10*3/uL (ref 4.0–10.5)

## 2015-12-14 LAB — HEPARIN LEVEL (UNFRACTIONATED)
HEPARIN UNFRACTIONATED: 0.23 [IU]/mL — AB (ref 0.30–0.70)
HEPARIN UNFRACTIONATED: 0.49 [IU]/mL (ref 0.30–0.70)
HEPARIN UNFRACTIONATED: 0.65 [IU]/mL (ref 0.30–0.70)

## 2015-12-14 MED ORDER — APIXABAN 5 MG PO TABS
5.0000 mg | ORAL_TABLET | Freq: Two times a day (BID) | ORAL | Status: DC
  Administered 2015-12-15 – 2015-12-16 (×3): 5 mg via ORAL
  Filled 2015-12-14 (×3): qty 1

## 2015-12-14 MED ORDER — PANTOPRAZOLE SODIUM 40 MG PO TBEC
40.0000 mg | DELAYED_RELEASE_TABLET | Freq: Every day | ORAL | Status: DC
Start: 2015-12-14 — End: 2015-12-16
  Administered 2015-12-14 – 2015-12-15 (×2): 40 mg via ORAL
  Filled 2015-12-14 (×2): qty 1

## 2015-12-14 MED ORDER — FAMOTIDINE 20 MG PO TABS
20.0000 mg | ORAL_TABLET | Freq: Two times a day (BID) | ORAL | Status: DC
  Administered 2015-12-14 – 2015-12-16 (×4): 20 mg via ORAL
  Filled 2015-12-14 (×4): qty 1

## 2015-12-14 MED ORDER — APIXABAN 5 MG PO TABS: 5.0000 mg | ORAL_TABLET | Freq: Two times a day (BID) | ORAL | Status: DC

## 2015-12-14 NOTE — Progress Notes (Signed)
ANTICOAGULATION CONSULT NOTE - Follow Up Consult  Pharmacy Consult for heparin Indication: atrial fibrillation  Allergies  Allergen Reactions  . Adhesive [Tape]     Surgical tape  . Avelox [Moxifloxacin Hcl In Nacl] Other (See Comments)    N/V/rash/swelling  . Tussionex Pennkinetic Er [Hydrocod Polst-Cpm Polst Er] Itching and Nausea And Vomiting  . Codeine Itching, Nausea And Vomiting and Rash    ALL FORMS OF CODEINE  . Penicillins Itching, Nausea And Vomiting, Swelling and Rash    ALL FORMS OF PENICILLINS Has patient had a PCN reaction causing immediate rash, facial/tongue/throat swelling, SOB or lightheadedness with hypotension: Yes Has patient had a PCN reaction causing severe rash involving mucus membranes or skin necrosis: No Has patient had a PCN reaction that required hospitalization No Has patient had a PCN reaction occurring within the last 10 years: No If all of the above answers are "NO", then may proceed with Cephalosporin use.    Patient Measurements: Height: 5\' 4"  (162.6 cm) Weight: 160 lb 4.8 oz (72.712 kg) IBW/kg (Calculated) : 54.7 Heparin Dosing Weight: 72 kg  Vital Signs: Temp: 98.3 F (36.8 C) (03/13 2021) Temp Source: Oral (03/13 2021) BP: 138/68 mmHg (03/13 2021) Pulse Rate: 105 (03/13 2021)  Labs:  Recent Labs  12/12/15 0455 12/13/15 0639 12/13/15 0640 12/14/15 0444  HGB 12.9  --  11.7* 12.3  HCT 40.2  --  37.3 38.4  PLT 253  --  255 265  HEPARINUNFRC 0.58 0.37  --  0.23*    Estimated Creatinine Clearance: 60.3 mL/min (by C-G formula based on Cr of 0.74).  Assessment: 75yo female on heparin for AFib, with subtherapeutic heparin level this AM. Hg and pltc wnl, stable. No issues with line or bleeding reported per RN.  Goal of Therapy:  Heparin level 0.3-0.7 units/ml Monitor platelets by anticoagulation protocol: Yes   Plan:  Increase heparin to 1000 units/hr F/u 8 hr heparin level F/U plans for oral [facility]  Sherlon Handing, PharmD,  BCPS Clinical pharmacist, pager [phone] 12/14/2015 5:48 AM

## 2015-12-14 NOTE — Progress Notes (Signed)
ANTICOAGULATION CONSULT NOTE - Follow Up Consult  Pharmacy Consult for Heparin  Indication: atrial fibrillation  Allergies  Allergen Reactions  . Adhesive [Tape]     Surgical tape  . Avelox [Moxifloxacin Hcl In Nacl] Other (See Comments)    N/V/rash/swelling  . Tussionex Pennkinetic Er [Hydrocod Polst-Cpm Polst Er] Itching and Nausea And Vomiting  . Codeine Itching, Nausea And Vomiting and Rash    ALL FORMS OF CODEINE  . Penicillins Itching, Nausea And Vomiting, Swelling and Rash    ALL FORMS OF PENICILLINS Has patient had a PCN reaction causing immediate rash, facial/tongue/throat swelling, SOB or lightheadedness with hypotension: Yes Has patient had a PCN reaction causing severe rash involving mucus membranes or skin necrosis: No Has patient had a PCN reaction that required hospitalization No Has patient had a PCN reaction occurring within the last 10 years: No If all of the above answers are "NO", then may proceed with Cephalosporin use.    Patient Measurements: Height: 5\' 4"  (162.6 cm) Weight: 160 lb 4.8 oz (72.712 kg) IBW/kg (Calculated) : 54.7   Vital Signs: Temp: 97.6 F (36.4 C) (03/14 2252) Temp Source: Oral (03/14 2252) BP: 131/80 mmHg (03/14 2252) Pulse Rate: 104 (03/14 2252)  Labs:  Recent Labs  12/12/15 0455  12/13/15 0640 12/14/15 0444 12/14/15 1430 12/14/15 2309  HGB 12.9  --  11.7* 12.3  --   --   HCT 40.2  --  37.3 38.4  --   --   PLT 253  --  255 265  --   --   HEPARINUNFRC 0.58  < >  --  0.23* 0.49 0.65  < > = values in this interval not displayed.  Estimated Creatinine Clearance: 60.3 mL/min (by C-G formula based on Cr of 0.74).   Assessment: Heparin level therapeutic x 2, starting apixaban tomorrow  Goal of Therapy:  Heparin level 0.3-0.7 units/ml Monitor platelets by anticoagulation protocol: Yes   Plan:  -Cont heparin 1000 units/hr -Daily CBC/HL -Monitor for bleeding  Narda Bonds 12/14/2015,11:40 PM

## 2015-12-14 NOTE — Progress Notes (Signed)
    Subjective:  Up in chair, still has NG in.  Objective:  Vital Signs in the last 24 hours: Temp:  [97.6 F (36.4 C)-98.3 F (36.8 C)] 98.1 F (36.7 C) (03/14 0617) Pulse Rate:  [105-124] 110 (03/14 0617) Resp:  [18] 18 (03/14 0617) BP: (131-138)/(67-85) 132/85 mmHg (03/14 0617) SpO2:  [94 %-98 %] 94 % (03/14 0617)  Intake/Output from previous day:  Intake/Output Summary (Last 24 hours) at 12/14/15 0852 Last data filed at 12/14/15 [id number]  Gross per 24 hour  Intake 2083.44 ml  Output   1575 ml  Net 508.44 ml    Physical Exam: General appearance: alert, cooperative and no distress Lungs: decreased breath sounds Rt base, crackles Lt base Heart: irregularly irregular rhythm Extremities: no edema   Rate: 90-120  Rhythm: atrial fibrillation, short runs of WCT- ? AF with abberrancy  Lab Results:  Recent Labs  12/13/15 0640 12/14/15 0444  WBC 8.0 7.7  HGB 11.7* 12.3  PLT 255 265   No results for input(s): NA, K, CL, CO2, GLUCOSE, BUN, CREATININE in the last 72 hours. No results for input(s): TROPONINI in the last 72 hours.  Invalid input(s): CK, MB No results for input(s): INR in the last 72 hours.  Scheduled Meds: . antiseptic oral rinse  7 mL Mouth Rinse BID  . famotidine (PEPCID) IV  20 mg Intravenous Q12H  . feeding supplement  1 Container Oral TID BM  . levothyroxine  75 mcg Oral Q breakfast  . metoprolol  5 mg Intravenous 4 times per day  . pantoprazole (PROTONIX) IV  40 mg Intravenous QHS   Continuous Infusions: . 0.9 % NaCl with KCl 20 mEq / L 50 mL/hr at 12/13/15 2212  . heparin 1,000 Units/hr (12/14/15 0621)  . lactated ringers     PRN Meds:.acetaminophen, albuterol, menthol-cetylpyridinium, metoprolol, morphine injection, ondansetron **OR** ondansetron (ZOFRAN) IV, oxyCODONE-acetaminophen, phenol   Imaging: Imaging results have been reviewed   Assessment/Plan:  75 y.o. admitted 12/05/15 with SBO. She underwent exploratory laparoscopy and lysis  of adhesions 12/09/15. We were called for AF with RVR. Review of her EKGs show that she was in AF with CVR on admission 12/05/15. She is totally asymptomatic and unaware of AF.   Principal Problem:   Small bowel obstruction-surgery 12/09/15 Active Problems:   Atrial fibrillation, new onset (HCC)   Hypothyroidism-(TSH WNL)   PLAN: Echo still pending. She is on IV Heparin and Lopressor. Start NOAC or Coumadin and po Lopressor when OK with surgery. We will follow. Her lung exam is a little concerning. Absent breath sounds 1/3 up on Rt, crackles Lt base. She denies SOB at rest and O2 sats stable on RA.  Consider a PA and Lat CXR today.   Kerin Ransom PA-C 12/14/2015, 8:52 AM [phone]    Attending Note:   The patient was seen and examined.  Agree with assessment and plan as noted above.  Changes made to the above note as needed.  The breath sounds on her right side are significantly decreased compared to the left side. Few rales left base. Will get a PA and lat CXR.  On heparin currently. We can start Eliquis 5 BID tomorrow .   Thayer Headings, Brooke Bonito., MD, [facility] 12/14/2015, 4:46 PM [address]  [place] Pager [phone]

## 2015-12-14 NOTE — Progress Notes (Signed)
[facility] for heparin Indication: atrial fibrillation  Allergies  Allergen Reactions  . Adhesive [Tape]     Surgical tape  . Avelox [Moxifloxacin Hcl In Nacl] Other (See Comments)    N/V/rash/swelling  . Tussionex Pennkinetic Er [Hydrocod Polst-Cpm Polst Er] Itching and Nausea And Vomiting  . Codeine Itching, Nausea And Vomiting and Rash    ALL FORMS OF CODEINE  . Penicillins Itching, Nausea And Vomiting, Swelling and Rash    ALL FORMS OF PENICILLINS Has patient had a PCN reaction causing immediate rash, facial/tongue/throat swelling, SOB or lightheadedness with hypotension: Yes Has patient had a PCN reaction causing severe rash involving mucus membranes or skin necrosis: No Has patient had a PCN reaction that required hospitalization No Has patient had a PCN reaction occurring within the last 10 years: No If all of the above answers are "NO", then may proceed with Cephalosporin use.    Patient Measurements: Height: 5\' 4"  (162.6 cm) Weight: 160 lb 4.8 oz (72.712 kg) IBW/kg (Calculated) : 54.7 Heparin Dosing Weight: 72 kg  Vital Signs: Temp: 98 F (36.7 C) (03/14 1352) Temp Source: Oral (03/14 1352) BP: 136/70 mmHg (03/14 1352) Pulse Rate: 105 (03/14 1352)  Labs:  Recent Labs  12/12/15 0455 12/13/15 0639 12/13/15 0640 12/14/15 0444 12/14/15 1430  HGB 12.9  --  11.7* 12.3  --   HCT 40.2  --  37.3 38.4  --   PLT 253  --  255 265  --   HEPARINUNFRC 0.58 0.37  --  0.23* 0.49    Estimated Creatinine Clearance: 60.3 mL/min (by C-G formula based on Cr of 0.74).  Assessment: 75yo female on heparin for AFib, therapeutic x1. Hg and pltc wnl, stable. No issues with line or bleeding reported per RN.  Goal of Therapy:  Heparin level 0.3-0.7 units/ml Monitor platelets by anticoagulation protocol: Yes   Plan:  Continue heparin at 1000 units/hr F/U plans for oral AC Daily HL, CBC Confirmatory level this evening    Hughes Better,  PharmD, BCPS Clinical Pharmacist Pager: [phone] 12/14/2015 3:12 PM

## 2015-12-14 NOTE — Discharge Instructions (Addendum)
Information on my medicine - ELIQUIS (apixaban)  This medication education was reviewed with me or my healthcare representative as part of my discharge preparation.  The pharmacist that spoke with me during my hospital stay was:  Lavenia Atlas, [facility]  Why was Eliquis prescribed for you? Eliquis was prescribed for you to reduce the risk of a blood clot forming that can cause a stroke if you have a medical condition called atrial fibrillation (a type of irregular heartbeat).  What do You need to know about Eliquis ? Take your Eliquis TWICE DAILY - one tablet in the morning and one tablet in the evening with or without food. If you have difficulty swallowing the tablet whole please discuss with your pharmacist how to take the medication safely.  Take Eliquis exactly as prescribed by your doctor and DO NOT stop taking Eliquis without talking to the doctor who prescribed the medication.  Stopping may increase your risk of developing a stroke.  Refill your prescription before you run out.  After discharge, you should have regular check-up appointments with your healthcare provider that is prescribing your Eliquis.  In the future your dose may need to be changed if your kidney function or weight changes by a significant amount or as you get older.  What do you do if you miss a dose? If you miss a dose, take it as soon as you remember on the same day and resume taking twice daily.  Do not take more than one dose of ELIQUIS at the same time to make up a missed dose.  Important Safety Information A possible side effect of Eliquis is bleeding. You should call your healthcare provider right away if you experience any of the following: ? Bleeding from an injury or your nose that does not stop. ? Unusual colored urine (red or dark brown) or unusual colored stools (red or black). ? Unusual bruising for unknown reasons. ? A serious fall or if you hit your head (even if there is no  bleeding).  Some medicines may interact with Eliquis and might increase your risk of bleeding or clotting while on Eliquis. To help avoid this, consult your healthcare provider or pharmacist prior to using any new prescription or non-prescription medications, including herbals, vitamins, non-steroidal anti-inflammatory drugs (NSAIDs) and supplements.  This website has more information on Eliquis (apixaban): http://www.eliquis.com/eliquis/home   ABDOMINAL SURGERY: POST OP INSTRUCTIONS  1. DIET: Follow a light bland diet the first 24 hours after arrival home, such as soup, liquids, crackers, etc.  Be sure to include lots of fluids daily.  Avoid fast food or heavy meals as your are more likely to get nauseated.  Eat a low fat the next few days after surgery.   2. Take your usually prescribed home medications unless otherwise directed. 3. PAIN CONTROL: a. Pain is best controlled by a usual combination of three different methods TOGETHER: i. Ice/Heat ii. Over the counter pain medication iii. Prescription pain medication b. Most patients will experience some swelling and bruising around the incisions.  Ice packs or heating pads (30-60 minutes up to 6 times a day) will help. Use ice for the first few days to help decrease swelling and bruising, then switch to heat to help relax tight/sore spots and speed recovery.  Some people prefer to use ice alone, heat alone, alternating between ice & heat.  Experiment to what works for you.  Swelling and bruising can take several weeks to resolve.   c. It is helpful to  take an over-the-counter pain medication regularly for the first few weeks.  Choose one of the following that works best for you: i. Naproxen (Aleve, etc)  Two 220mg  tabs twice a day ii. Ibuprofen (Advil, etc) Three 200mg  tabs four times a day (every meal & bedtime) iii. Acetaminophen (Tylenol, etc) 500-650mg  four times a day (every meal & bedtime) d. A  prescription for pain medication (such as  oxycodone, hydrocodone, etc) should be given to you upon discharge.  Take your pain medication as prescribed.  i. If you are having problems/concerns with the prescription medicine (does not control pain, nausea, vomiting, rash, itching, etc), please call us [phone] to see if we need to switch you to a different pain medicine that will work better for you and/or control your side effect better. ii. If you need a refill on your pain medication, please contact your pharmacy.  They will contact our office to request authorization. Prescriptions will not be filled after 5 pm or on week-ends. 4. Avoid getting constipated.  Between the surgery and the pain medications, it is common to experience some constipation.  Increasing fluid intake and taking a fiber supplement (such as Metamucil, Citrucel, FiberCon, MiraLax, etc) 1-2 times a day regularly will usually help prevent this problem from occurring.  A mild laxative (prune juice, Milk of Magnesia, MiraLax, etc) should be taken according to package directions if there are no bowel movements after 48 hours.   5. Watch out for diarrhea.  If you have many loose bowel movements, simplify your diet to bland foods & liquids for a few days.  Stop any stool softeners and decrease your fiber supplement.  Switching to mild anti-diarrheal medications (Kayopectate, Pepto Bismol) can help.  If this worsens or does not improve, please call us. 6. Wash / shower every day.  You may shower over the incision / wound.  Avoid baths until the skin is fully healed.  Continue to shower over incision(s) after the dressing is off. 7. Remove your waterproof bandages 5 days after surgery.  You may leave the incision open to air.  You may replace a dressing/Band-Aid to cover the incision for comfort if you wish. 8. ACTIVITIES as tolerated:   a. You may resume regular (light) daily activities beginning the next day--such as daily self-care, walking, climbing stairs--gradually  increasing activities as tolerated.  If you can walk 30 minutes without difficulty, it is safe to try more intense activity such as jogging, treadmill, bicycling, low-impact aerobics, swimming, etc. b. Save the most intensive and strenuous activity for last such as sit-ups, heavy lifting, contact sports, etc  Refrain from any heavy lifting or straining until you are off narcotics for pain control.   c. DO NOT PUSH THROUGH PAIN.  Let pain be your guide: If it hurts to do something, don't do it.  Pain is your body warning you to avoid that activity for another week until the pain goes down. d. You may drive when you are no longer taking prescription pain medication, you can comfortably wear a seatbelt, and you can safely maneuver your car and apply brakes. e. Dennis Bast may have sexual intercourse when it is comfortable.  9. FOLLOW UP in our office a. Please call [facility] at [phone] to set up an appointment to see your surgeon in the office for a follow-up appointment approximately 1-2 weeks after your surgery. b. Make sure that you call for this appointment the day you arrive home to insure a convenient appointment time.  10. IF YOU HAVE DISABILITY OR FAMILY LEAVE FORMS, BRING THEM TO THE OFFICE FOR PROCESSING.  DO NOT GIVE THEM TO YOUR DOCTOR.   WHEN TO CALL us [phone]: 1. Poor pain control 2. Reactions / problems with new medications (rash/itching, nausea, etc)  3. Fever over 101.5 F (38.5 C) 4. Inability to urinate 5. Nausea and/or vomiting 6. Worsening swelling or bruising 7. Continued bleeding from incision. 8. Increased pain, redness, or drainage from the incision  The clinic staff is available to answer your questions during regular business hours (8:30am-5pm).  Please dont hesitate to call and ask to speak to one of our nurses for clinical concerns.   A surgeon from [facility] is always on call at the hospitals   If you have a medical emergency, go to the nearest  emergency room or call 911.    [facility], [place], [place], [place], [place]  [address] ? MAIN: [phone] ? TOLL FREE: [phone] ? FAX [phone] [email or web address]

## 2015-12-14 NOTE — Progress Notes (Addendum)
[place] for heparin>>Eliquis  Indication: atrial fibrillation  Allergies  Allergen Reactions  . Adhesive [Tape]     Surgical tape  . Avelox [Moxifloxacin Hcl In Nacl] Other (See Comments)    N/V/rash/swelling  . Tussionex Pennkinetic Er [Hydrocod Polst-Cpm Polst Er] Itching and Nausea And Vomiting  . Codeine Itching, Nausea And Vomiting and Rash    ALL FORMS OF CODEINE  . Penicillins Itching, Nausea And Vomiting, Swelling and Rash    ALL FORMS OF PENICILLINS Has patient had a PCN reaction causing immediate rash, facial/tongue/throat swelling, SOB or lightheadedness with hypotension: Yes Has patient had a PCN reaction causing severe rash involving mucus membranes or skin necrosis: No Has patient had a PCN reaction that required hospitalization No Has patient had a PCN reaction occurring within the last 10 years: No If all of the above answers are "NO", then may proceed with Cephalosporin use.    Patient Measurements: Height: 5\' 4"  (162.6 cm) Weight: 160 lb 4.8 oz (72.712 kg) IBW/kg (Calculated) : 54.7 Heparin Dosing Weight: 72 kg  Vital Signs: Temp: 98 F (36.7 C) (03/14 1352) Temp Source: Oral (03/14 1352) BP: 136/70 mmHg (03/14 1352) Pulse Rate: 105 (03/14 1352)  Labs:  Recent Labs  12/12/15 0455 12/13/15 0639 12/13/15 0640 12/14/15 0444 12/14/15 1430  HGB 12.9  --  11.7* 12.3  --   HCT 40.2  --  37.3 38.4  --   PLT 253  --  255 265  --   HEPARINUNFRC 0.58 0.37  --  0.23* 0.49    Estimated Creatinine Clearance: 60.3 mL/min (by C-G formula based on Cr of 0.74).  Assessment: 75yo female on heparin for AFib, therapeutic x1. Hg and pltc wnl, stable. Pharmacy consulted to switch to apixaban. Wt 72 kg, SCr 0.74. Patient qualifies for full dose apixaban.   Goal of Therapy:  Stroke prevention  Monitor platelets by anticoagulation protocol: Yes   Plan:  Stop heparin at 1000 tomorrow and given first dose of Eliquis Start  Eliquis 5 mg twice daily  Educate patient on [place], PharmD., [facility]PS Clinical Pharmacist Pager [phone]

## 2015-12-14 NOTE — Progress Notes (Signed)
5 Days Post-Op  Subjective: Passing a lot of gas, no BM, tolerating clears with NGT clamped  Objective: Vital signs in last 24 hours: Temp:  [97.6 F (36.4 C)-98.3 F (36.8 C)] 98.1 F (36.7 C) (03/14 0617) Pulse Rate:  [105-124] 110 (03/14 0617) Resp:  [18] 18 (03/14 0617) BP: (131-138)/(67-85) 132/85 mmHg (03/14 0617) SpO2:  [94 %-98 %] 94 % (03/14 0617) Last BM Date: 12/04/15  Intake/Output from previous day: 03/13 0701 - 03/14 0700 In: 2083.4 [P.O.:480; I.V.:1553.4; IV Piggyback:50] Out: 1575 [Urine:1575] Intake/Output this shift:    General appearance: alert and cooperative Resp: clear to auscultation bilaterally GI: soft, incision CDI, active BS  Lab Results:   Recent Labs  12/13/15 0640 12/14/15 0444  WBC 8.0 7.7  HGB 11.7* 12.3  HCT 37.3 38.4  PLT 255 265   BMET No results for input(s): NA, K, CL, CO2, GLUCOSE, BUN, CREATININE, CALCIUM in the last 72 hours. PT/INR No results for input(s): LABPROT, INR in the last 72 hours. ABG No results for input(s): PHART, HCO3 in the last 72 hours.  Invalid input(s): PCO2, PO2  Studies/Results: No results found.  Anti-infectives: Anti-infectives    None      Assessment/Plan: s/p Procedure(s): EXPLORATORY LAPAROTOMY, LYSIS OF ADHESIONS  (N/A) POD#5 D/C NGT, clears, fulls starting at dinner A fib - per cards VTE - heparin drip, can change to PO agent per cards  LOS: 9 days    Ryelynn Guedea E 12/14/2015

## 2015-12-14 NOTE — Care Management Note (Signed)
ase Management Note  Patient Details  Name: Tracy [name] MRN: [id number] Date of Birth: Nov 08, 1940  Subjective/Objective:                    Action/Plan:   Expected Discharge Date:                Expected Discharge Plan:  Home/Self Care  In-House Referral:     Discharge planning Services     Post Acute Care Choice:    Choice offered to:     DME Arranged:    DME Agency:     HH Arranged:    Major Agency:     Status of Service:  In process, will continue to follow  Medicare Important Message Given:  Other (see comment) (Not yet needed per NCM) Date Medicare IM Given:    Medicare IM give by:    Date Additional Medicare IM Given:    Additional Medicare Important Message give by:     If discussed at [place] of Stay Meetings, dates discussed:  12-14-15  Additional Comments: UR updated  Marilu Favre, RN 12/14/2015, 10:12 AM

## 2015-12-14 NOTE — Care Management Important Message (Signed)
Important Message  Patient Details  Name: Tracy [name] MRN: [id number] Date of Birth: 10/25/1940   Medicare Important Message Given:  Yes    [name] [name] 12/14/2015, 12:34 PM

## 2015-12-15 ENCOUNTER — Inpatient Hospital Stay (HOSPITAL_COMMUNITY): Payer: Medicare Other

## 2015-12-15 DIAGNOSIS — I4891 Unspecified atrial fibrillation: Secondary | ICD-10-CM

## 2015-12-15 LAB — CBC
HCT: 37.6 % (ref 36.0–46.0)
Hemoglobin: 12 g/dL (ref 12.0–15.0)
MCH: 30.6 pg (ref 26.0–34.0)
MCHC: 31.9 g/dL (ref 30.0–36.0)
MCV: 95.9 fL (ref 78.0–100.0)
PLATELETS: 277 10*3/uL (ref 150–400)
RBC: 3.92 MIL/uL (ref 3.87–5.11)
RDW: 12.5 % (ref 11.5–15.5)
WBC: 6.3 10*3/uL (ref 4.0–10.5)

## 2015-12-15 LAB — ECHOCARDIOGRAM COMPLETE
Height: 64 in
WEIGHTICAEL: 2564.8 [oz_av]

## 2015-12-15 MED ORDER — DOCUSATE SODIUM 100 MG PO CAPS
100.0000 mg | ORAL_CAPSULE | Freq: Two times a day (BID) | ORAL | Status: DC
  Administered 2015-12-15 – 2015-12-16 (×3): 100 mg via ORAL
  Filled 2015-12-15 (×3): qty 1

## 2015-12-15 MED ORDER — BISACODYL 10 MG RE SUPP
10.0000 mg | Freq: Once | RECTAL | Status: AC
  Administered 2015-12-15: 10 mg via RECTAL
  Filled 2015-12-15: qty 1

## 2015-12-15 MED ORDER — BOOST / RESOURCE BREEZE PO LIQD
1.0000 | Freq: Two times a day (BID) | ORAL | Status: DC
Start: 2015-12-16 — End: 2015-12-16

## 2015-12-15 MED ORDER — POLYETHYLENE GLYCOL 3350 17 G PO PACK
17.0000 g | PACK | Freq: Every day | ORAL | Status: DC
  Administered 2015-12-15: 17 g via ORAL
  Filled 2015-12-15: qty 1

## 2015-12-15 MED ORDER — METOPROLOL TARTRATE 25 MG PO TABS
25.0000 mg | ORAL_TABLET | Freq: Two times a day (BID) | ORAL | Status: DC
Start: 2015-12-15 — End: 2015-12-16
  Administered 2015-12-15 – 2015-12-16 (×2): 25 mg via ORAL
  Filled 2015-12-15 (×2): qty 1

## 2015-12-15 MED ORDER — APIXABAN 5 MG PO TABS: 5.0000 mg | ORAL_TABLET | Freq: Two times a day (BID) | ORAL | Status: DC

## 2015-12-15 NOTE — Progress Notes (Signed)
Nutrition Follow-up  DOCUMENTATION CODES:   Not applicable  INTERVENTION:   -Decrease Boost Breeze po to BID, each supplement provides 250 kcal and 9 grams of protein  NUTRITION DIAGNOSIS:   Inadequate oral intake related to altered GI function as evidenced by  (NPO/clear liquids x 8 days).  Progressing  GOAL:   Patient will meet greater than or equal to 90% of their needs  Progressing  MONITOR:   Diet advancement, Supplement acceptance, PO intake, Labs, Weight trends, Skin, I & O's  REASON FOR ASSESSMENT:   NPO/Clear Liquid Diet    ASSESSMENT:   Tracy [name] is a 75 year old female with a past medical history significant for hypothyroidism, mitral valve prolapse, status post appendectomy, and bilateral tubal ligation; who presents with complaints of of generalized abdominal pain with nausea and vomiting symptoms over the last day.  NGT was d/c on 12/14/15.   Diet was advanced to soft diet today at lunch. Intake has improved since diet advancement. Noted 25-100% meal completion. Pt also taking Boost Breeze supplements well.   Noted pt still with no BM.   Labs reviewed.   Diet Order:  DIET SOFT Room service appropriate?: Yes; Fluid consistency:: Thin  Skin:  Reviewed, no issues  Last BM:  12/04/15  Height:   Ht Readings from Last 1 Encounters:  12/05/15 5\' 4"  (1.626 m)    Weight:   Wt Readings from Last 1 Encounters:  12/05/15 160 lb 4.8 oz (72.712 kg)    Ideal Body Weight:  54.5 kg  BMI:  Body mass index is 27.5 kg/(m^2).  Estimated Nutritional Needs:   Kcal:  1550-1750  Protein:  85-100 grams  Fluid:  1.5-1.7 L  EDUCATION NEEDS:   No education needs identified at this time  Tracy [name] A. Jimmye Norman, RD, LDN, CDE Pager: [phone] After hours Pager: [phone]

## 2015-12-15 NOTE — Progress Notes (Signed)
Subjective:  Up in chair, NG is out  Objective:  Vital Signs in the last 24 hours: Temp:  [97.6 F (36.4 C)-98 F (36.7 C)] 97.8 F (36.6 C) (03/15 0641) Pulse Rate:  [94-105] 94 (03/15 0641) Resp:  [16-18] 18 (03/15 0641) BP: (122-136)/(61-80) 122/61 mmHg (03/15 0641) SpO2:  [95 %-97 %] 95 % (03/15 0641)  Intake/Output from previous day:  Intake/Output Summary (Last 24 hours) at 12/15/15 1203 Last data filed at 12/15/15 0900  Gross per 24 hour  Intake 1906.67 ml  Output   3650 ml  Net -1743.33 ml    Physical Exam: General appearance: alert, cooperative and no distress Lungs: decreased breath sounds Rt base, crackles Lt base Heart: irregularly irregular rhythm Extremities: no edema   Rate: 90-120  Rhythm: atrial fibrillation, short runs of WCT- ? AF with abberrancy  Lab Results:  Recent Labs  12/14/15 0444 12/15/15 0445  WBC 7.7 6.3  HGB 12.3 12.0  PLT 265 277   No results for input(s): NA, K, CL, CO2, GLUCOSE, BUN, CREATININE in the last 72 hours. No results for input(s): TROPONINI in the last 72 hours.  Invalid input(s): CK, MB No results for input(s): INR in the last 72 hours.  Scheduled Meds: . antiseptic oral rinse  7 mL Mouth Rinse BID  . apixaban  5 mg Oral BID  . bisacodyl  10 mg Rectal Once  . docusate sodium  100 mg Oral BID  . famotidine  20 mg Oral BID  . feeding supplement  1 Container Oral TID BM  . levothyroxine  75 mcg Oral Q breakfast  . metoprolol  5 mg Intravenous 4 times per day  . pantoprazole  40 mg Oral Daily  . polyethylene glycol  17 g Oral Daily   Continuous Infusions: . lactated ringers     PRN Meds:.acetaminophen, albuterol, menthol-cetylpyridinium, metoprolol, morphine injection, ondansetron **OR** ondansetron (ZOFRAN) IV, oxyCODONE-acetaminophen, phenol   Imaging: CXR- 12/15/15 CHEST 2 VIEW  COMPARISON: CT chest of 05/26/2013 and chest x-ray of 04/08/2012  FINDINGS: The lungs are not as well aerated. There  are small bilateral pleural effusions present. The heart is within upper limits of normal. No bony abnormality is seen.  IMPRESSION: Small bilateral pleural effusions.    Assessment/Plan:  75 y.o. admitted 12/05/15 with SBO. She underwent exploratory laparoscopy and lysis of adhesions 12/09/15. We were called for AF with RVR. Review of her EKGs show that she was in AF with CVR on admission 12/05/15. She is totally asymptomatic and unaware of AF.   Principal Problem:   Small bowel obstruction-surgery 12/09/15 Active Problems:   Atrial fibrillation, new onset (HCC)   Hypothyroidism-(TSH WNL)   PLAN: Echo still pending- done 12/12/15. Change Lopressor to po.  Pt given free 30 day Eliquis Rx. F/U in cardiology office in 2 weeks.   Kerin Ransom PA-C 12/15/2015, 12:03 PM [phone]  Attending Note:   The patient was seen and examined.  Agree with assessment and plan as noted above.  Changes made to the above note as needed.  Pt is doing well  in atrial fib Echo to be done today .   I've called echo again and they will do it today  She will stay on Eliquis. Possible cardioversion in 1 month She tolerates it very well. We may opt for anticoagulation and rate control   She may be discharged tomorrow   Thayer Headings, Brooke Bonito., MD, [facility] 12/15/2015, 1:28 PM [address]  [address] Office -  [phone] Pager [phone]

## 2015-12-15 NOTE — Progress Notes (Signed)
Patient ID: Tracy [name], female   DOB: [birth year], 75 y.o.   MRN: [id number]     [facility]      [address] [place], [place] [id number]    Phone: [phone] FAX: [phone]     Subjective: Tolerating fulls.  Passing flatus. VSS.  Afebrile.  Endorses to sob, sats are stable on room air. Ambulating in hallways.   Objective:  Vital signs:  Filed Vitals:   12/14/15 0617 12/14/15 1352 12/14/15 2252 12/15/15 0641  BP: 132/85 136/70 131/80 122/61  Pulse: 110 105 104 94  Temp: 98.1 F (36.7 C) 98 F (36.7 C) 97.6 F (36.4 C) 97.8 F (36.6 C)  TempSrc: Oral Oral Oral   Resp: 18 16 18 18   Height:      Weight:      SpO2: 94% 96% 97% 95%    Last BM Date: 12/04/15  Intake/Output   Yesterday:  03/14 0701 - 03/15 0700 In: 2026.7 [P.O.:1200; I.V.:826.7] Out: 4000 [Urine:4000] This shift:  Total I/O In: 240 [P.O.:240] Out: -    Physical Exam: General: Pt awake/alert/oriented x4 in no acute distress Chest: cta. No chest wall pain w good excursion CV: s1s2 irregularly irregular MS: Normal AROM mjr joints. No obvious deformity Abdomen: Soft. Nondistended. Mildly tender at incisions only. Incision without erythema, staples in place with approximated wound edges. No evidence of peritonitis. No incarcerated hernias. Ext: SCDs BLE. No mjr edema. No cyanosis Skin: No petechiae / purpura  Problem List:   Principal Problem:   Small bowel obstruction-surgery 12/09/15 Active Problems:   Hypothyroidism-(TSH WNL)   Atrial fibrillation, new onset ([place])    Results:   Labs: Results for orders placed or performed during the hospital encounter of 12/04/15 (from the past 48 hour(s))  Heparin level (unfractionated)     Status: Abnormal   Collection Time: 12/14/15  4:44 AM  Result Value Ref Range   Heparin Unfractionated 0.23 (L) 0.30 - 0.70 IU/mL    Comment:        IF HEPARIN RESULTS ARE BELOW EXPECTED VALUES, AND  PATIENT DOSAGE HAS BEEN CONFIRMED, SUGGEST FOLLOW UP TESTING OF ANTITHROMBIN III LEVELS.   CBC     Status: None   Collection Time: 12/14/15  4:44 AM  Result Value Ref Range   WBC 7.7 4.0 - 10.5 K/uL   RBC 3.99 3.87 - 5.11 MIL/uL   Hemoglobin 12.3 12.0 - 15.0 g/dL   HCT 38.4 36.0 - 46.0 %   MCV 96.2 78.0 - 100.0 fL   MCH 30.8 26.0 - 34.0 pg   MCHC 32.0 30.0 - 36.0 g/dL   RDW 12.6 11.5 - 15.5 %   Platelets 265 150 - 400 K/uL  Heparin level (unfractionated)     Status: None   Collection Time: 12/14/15  2:30 PM  Result Value Ref Range   Heparin Unfractionated 0.49 0.30 - 0.70 IU/mL    Comment:        IF HEPARIN RESULTS ARE BELOW EXPECTED VALUES, AND PATIENT DOSAGE HAS BEEN CONFIRMED, SUGGEST FOLLOW UP TESTING OF ANTITHROMBIN III LEVELS.   Heparin level (unfractionated)     Status: None   Collection Time: 12/14/15 11:09 PM  Result Value Ref Range   Heparin Unfractionated 0.65 0.30 - 0.70 IU/mL    Comment:        IF HEPARIN RESULTS ARE BELOW EXPECTED VALUES, AND PATIENT DOSAGE HAS BEEN CONFIRMED, SUGGEST FOLLOW UP TESTING OF ANTITHROMBIN III LEVELS.   CBC  Status: None   Collection Time: 12/15/15  4:45 AM  Result Value Ref Range   WBC 6.3 4.0 - 10.5 K/uL   RBC 3.92 3.87 - 5.11 MIL/uL   Hemoglobin 12.0 12.0 - 15.0 g/dL   HCT 37.6 36.0 - 46.0 %   MCV 95.9 78.0 - 100.0 fL   MCH 30.6 26.0 - 34.0 pg   MCHC 31.9 30.0 - 36.0 g/dL   RDW 12.5 11.5 - 15.5 %   Platelets 277 150 - 400 K/uL    Imaging / Studies: Dg Chest 2 View  12/15/2015  CLINICAL DATA:  Shortness of breath, weakness EXAM: CHEST  2 VIEW COMPARISON:  CT chest of 05/26/2013 and chest x-ray of 04/08/2012 FINDINGS: The lungs are not as well aerated. There are small bilateral pleural effusions present. The heart is within upper limits of normal. No bony abnormality is seen. IMPRESSION: Small bilateral pleural effusions. Electronically Signed   By: Ivar Drape M.D.   On: 12/15/2015 08:38    Medications /  Allergies:  Scheduled Meds: . antiseptic oral rinse  7 mL Mouth Rinse BID  . apixaban  5 mg Oral BID  . docusate sodium  100 mg Oral BID  . famotidine  20 mg Oral BID  . feeding supplement  1 Container Oral TID BM  . levothyroxine  75 mcg Oral Q breakfast  . metoprolol  5 mg Intravenous 4 times per day  . pantoprazole  40 mg Oral Daily  . polyethylene glycol  17 g Oral Daily   Continuous Infusions: . lactated ringers     PRN Meds:.acetaminophen, albuterol, menthol-cetylpyridinium, metoprolol, morphine injection, ondansetron **OR** ondansetron (ZOFRAN) IV, oxyCODONE-acetaminophen, phenol  Antibiotics: Anti-infectives    None       Assessment/Plan SBO POD#6 diagnostic laparoscopy, laparotomy, LOA---Dr. Dalbert Batman -stable, advance diet, add colace and miralax  -up to chair, mobilize, PT eval -IS Bilateral pleural effusions-stop IV fluids Hypothyroidism-home meds Atrial fibrillation-appreciate cardiology assistance. May change IV lopressor to PO.   FEN-SLIV VTE prophylaxis-SCD, eliquis  Dispo-anticipate DC 1-2 days   Erby Pian, [facility] Pager [phone]) For consults and floor pages call [phone])  12/15/2015 10:28 AM

## 2015-12-15 NOTE — Progress Notes (Signed)
Echocardiogram 2D Echocardiogram has been performed.  Tracy [name] 12/15/2015, 2:19 PM

## 2015-12-15 NOTE — Care Management Note (Addendum)
Case Management Note  Patient Details  Name: PAMALEE LUSKEY MRN: [id number] Date of Birth: May 26, 1941  Subjective/Objective:                    Action/Plan: Patient started on Eliquis 5 mg PO BID . CM able to provide 30 day free card . Will request benefits check to determine patient's co pay and if pre authorization is required.  PT COPAY WILL BE $45 - PRIOR AUTH IS REQUIRED [phone]  Notified Kerin Ransom PA-C  [phone]  Patient given 30 day free card and aware co pay after that is $45 / month. Patient uses [employer] [employer] [facility] and they have Eliquis 5 mg PO BID in stock .   Patient and visitor aware and voiced understanding   Expected Discharge Date:  12/08/15               Expected Discharge Plan:  Home/Self Care  In-House Referral:     Discharge planning Services     Post Acute Care Choice:    Choice offered to:     DME Arranged:    DME Agency:     HH Arranged:    HH Agency:     Status of Service:  In process, will continue to follow  Medicare Important Message Given:  Yes Date Medicare IM Given:    Medicare IM give by:    Date Additional Medicare IM Given:    Additional Medicare Important Message give by:     If discussed at [employer] of Stay Meetings, dates discussed:    Additional Comments:  Marilu Favre, RN 12/15/2015, 10:09 AM

## 2015-12-15 NOTE — Progress Notes (Signed)
[facility] called and requested that her Eliquis prescription please be pre-approved by her insurance.  RN informed pharmacist that Case Management was setting patient up with Eliquis discount card, which pharmacist stated was great.

## 2015-12-16 LAB — CBC
HCT: 41.1 % (ref 36.0–46.0)
HEMOGLOBIN: 13.4 g/dL (ref 12.0–15.0)
MCH: 31.3 pg (ref 26.0–34.0)
MCHC: 32.6 g/dL (ref 30.0–36.0)
MCV: 96 fL (ref 78.0–100.0)
PLATELETS: 313 10*3/uL (ref 150–400)
RBC: 4.28 MIL/uL (ref 3.87–5.11)
RDW: 12.6 % (ref 11.5–15.5)
WBC: 8.8 10*3/uL (ref 4.0–10.5)

## 2015-12-16 MED ORDER — METOPROLOL TARTRATE 25 MG PO TABS: 25.0000 mg | ORAL_TABLET | Freq: Two times a day (BID) | ORAL | Status: DC

## 2015-12-16 MED ORDER — OXYCODONE-ACETAMINOPHEN 5-325 MG PO TABS: 1.0000 | ORAL_TABLET | ORAL | Status: DC | PRN

## 2015-12-16 MED ORDER — DOCUSATE SODIUM 100 MG PO CAPS: 100.0000 mg | ORAL_CAPSULE | Freq: Two times a day (BID) | ORAL | Status: DC

## 2015-12-16 MED ORDER — POLYETHYLENE GLYCOL 3350 17 G PO PACK: 17.0000 g | PACK | Freq: Every day | ORAL | Status: DC

## 2015-12-16 NOTE — Discharge Summary (Signed)
Physician Discharge Summary  Tracy [name] [id number] DOB: May 08, 1941 DOA: 12/04/2015  PCP: Aretta Nip, MD  Consultation: hospitalist service(transferred to surgery post op)   Cardiology---Dr. Acie Fredrickson   Admit date: 12/04/2015 Discharge date: 12/16/2015  Recommendations for Outpatient Follow-up:   Follow-up Information    Follow up with Jenkins Rouge, MD.   Specialty:  Cardiology   Why:  office will contact you   Contact information:   [address] [address] [phone]       Follow up with [facility] On 12/21/2015.   Specialty:  General Surgery   Why:  appt time: 1030AM to have staples removed   Contact information:   [address] [place] [address] [phone]       Follow up with Adin Hector, MD On 01/06/2016.   Specialty:  General Surgery   Why:  arrive by 10:15AM for a 10:45AM post op check with your surgeon   Contact information:   [address] [place] [address] [phone]      Discharge Diagnoses:  1. Small bowel obstruction 2. Hypothyroidism 3. Atrial fibrillation  4. Bilateral mild pleural effusions    Surgical Procedure: diagnostic laparoscopy, laparotomy, LOA---Dr. Dalbert Batman  Discharge Condition: stable Disposition: home  Diet recommendation: heart healthy  [facility]   12/04/15 2028 12/05/15 0137  Weight: 73.624 kg (162 lb 5 oz) 72.712 kg (160 lb 4.8 oz)     Filed Vitals:   12/15/15 2255 12/16/15 0616  BP: 132/63 126/71  Pulse: 99 90  Temp: 97.8 F (36.6 C) 97.7 F (36.5 C)  Resp: 17 18     Hospital Course:  Tracy [name] is a 75 year old female with a history of hypothyroidism, appendectomy and a tubal ligation who presented with abdominal pain.  CT demonstrated a small bowel obstruction.  She was admitted under the medicine service and general surgery was consulted.  Small bowel obstruction protocol was initiated.  Initially was improving non  operatively.  After initiating clears, developed nausea and vomiting, repeat imaging showed a persistent obstruction.  She therefore underwent the procedure listed above.  She was found to have adhesions.  Tolerated the procedure well and was transferred back to the floor.  She was found to be in atrial fibrillation pre operatively and cardiology was consulted who recommended anticoagulation.  As ileus resolved, diet as advanced, the patient was mobilized, transitioned to eliquis per cardiology. Found to have mild bilateral pleural effusions on a chest x ray.  She remained asymptomatic and fluids were stopped.  On POD#7 she was felt stable for discharge home.  Medication risks, benefits and therapeutic alternatives were reviewed with the patient.  She verbalizes understanding. Her follow up was arranged.  She was encouraged to call with questions or concerns.    Physical Exam: General: Pt awake/alert/oriented x4 in no acute distress Chest: cta. No chest wall pain w good excursion CV: s1s2 irregularly irregular MS: Normal AROM mjr joints. No obvious deformity Abdomen: Soft. Nondistended. Mildly tender at incisions only. Incision without erythema, staples in place with approximated wound edges. No evidence of peritonitis. No incarcerated hernias. Ext: SCDs BLE. No mjr edema. No cyanosis Skin: No petechiae / purpura  Discharge Instructions     Medication List    STOP taking these medications        fluticasone 50 MCG/ACT nasal spray  Commonly known as:  FLONASE      TAKE these medications  acetaminophen 500 MG tablet  Commonly known as:  TYLENOL  Take 1,000 mg by mouth every 6 (six) hours as needed (pain).     AEROCHAMBER MV inhaler  Use as instructed     albuterol 108 (90 Base) MCG/ACT inhaler  Commonly known as:  PROVENTIL HFA;VENTOLIN HFA  Inhale 2 puffs into the lungs every 6 (six) hours as needed for wheezing or shortness of breath.     apixaban 5 MG Tabs tablet   Commonly known as:  ELIQUIS  Take 1 tablet (5 mg total) by mouth 2 (two) times daily.     Calcium-Vitamin D 600-200 MG-UNIT Caps  Take 1 tablet by mouth 2 (two) times daily.     docusate sodium 100 MG capsule  Commonly known as:  COLACE  Take 1 capsule (100 mg total) by mouth 2 (two) times daily.     ezetimibe-simvastatin 10-40 MG tablet  Commonly known as:  VYTORIN  Take 1 tablet by mouth at bedtime.     levothyroxine 75 MCG tablet  Commonly known as:  SYNTHROID, LEVOTHROID  Take 75 mcg by mouth daily.     metoprolol tartrate 25 MG tablet  Commonly known as:  LOPRESSOR  Take 1 tablet (25 mg total) by mouth 2 (two) times daily.     montelukast 10 MG tablet  Commonly known as:  SINGULAIR  Take 10 mg by mouth at bedtime as needed (seasonal allergies).     multivitamin with minerals Tabs tablet  Take 1 tablet by mouth daily.     oxyCODONE-acetaminophen 5-325 MG tablet  Commonly known as:  PERCOCET/ROXICET  Take 1-2 tablets by mouth every 4 (four) hours as needed for moderate pain or severe pain.     polyethylene glycol packet  Commonly known as:  MIRALAX / GLYCOLAX  Take 17 g by mouth daily.     QC LORATADINE ALLERGY RELIEF 10 MG tablet  Generic drug:  loratadine  TAKE 1 TABLET (10 MG TOTAL) BY MOUTH DAILY.     ranitidine 300 MG tablet  Commonly known as:  ZANTAC  Take 300 mg by mouth 2 (two) times daily as needed for heartburn (acid reflux).           Follow-up Information    Follow up with Jenkins Rouge, MD.   Specialty:  Cardiology   Why:  office will contact you   Contact information:   [id number] [address] [phone]       Follow up with [facility] On 12/21/2015.   Specialty:  General Surgery   Why:  appt time: 1030AM to have staples removed   Contact information:   [address] [place] [address] [phone]       Follow up with Adin Hector, MD On 01/06/2016.   Specialty:  General  Surgery   Why:  arrive by 10:15AM for a 10:45AM post op check with your surgeon   Contact information:   [place] [address] [phone]        The results of significant diagnostics from this hospitalization (including imaging, microbiology, ancillary and laboratory) are listed below for reference.    Significant Diagnostic Studies: Dg Chest 2 View  12/15/2015  CLINICAL DATA:  Shortness of breath, weakness EXAM: CHEST  2 VIEW COMPARISON:  CT chest of 05/26/2013 and chest x-ray of 04/08/2012 FINDINGS: The lungs are not as well aerated. There are small bilateral pleural effusions present. The heart  is within upper limits of normal. No bony abnormality is seen. IMPRESSION: Small bilateral pleural effusions. Electronically Signed   By: Ivar Drape M.D.   On: 12/15/2015 08:38   Ct Abdomen Pelvis W Contrast  12/04/2015  CLINICAL DATA:  75 year old female with lower abdominal pain and nausea. EXAM: CT ABDOMEN AND PELVIS WITH CONTRAST TECHNIQUE: Multidetector CT imaging of the abdomen and pelvis was performed using the standard protocol following bolus administration of intravenous contrast. CONTRAST:  180mL OMNIPAQUE IOHEXOL 300 MG/ML  SOLN COMPARISON:  CT dated 09/14/2008 FINDINGS: The visualized lung bases are clear. No intra-abdominal free air or free fluid. The liver, gallbladder, pancreas, spleen, adrenal glands appear unremarkable. Mild bilateral renal atrophy. There is no hydronephrosis on either side. The visualized ureters and urinary bladder appear unremarkable. The uterus appear grossly unremarkable. Multiple top-normal caliber fluid-filled loops of small bowel noted in the lower abdomen and pelvis. These measure up to 2.4 cm in diameter. The distal and terminal ileum are collapsed. A focal area of narrowing in the small bowel in the right hemipelvis (series 201 image 56 and coronal series 203, image 57) noted likely related to underlying adhesions. There is a small  probable metallic clip adjacent to this area likely from tubal ligation. Moderate stool noted throughout the colon. Appendectomy. The abdominal aorta and IVC appear unremarkable. No portal venous gas identified. There is no adenopathy. Small fat containing umbilical hernia. The abdominal wall soft tissues appear unremarkable. There is mild degenerative changes of spine. No acute fracture. IMPRESSION: Multiple top-normal caliber fluid-filled loops of small bowel in the mid and lower abdomen with transition zone in the right hemipelvis. Findings most consistent with an early small-bowel obstruction likely related to adhesions. Clinical correlation and follow-up recommended. Electronically Signed   By: Anner Crete M.D.   On: 12/04/2015 23:48   Dg Abd 2 Views  12/07/2015  CLINICAL DATA:  75 year old female with nausea and vomiting with abdominal pain for 3 days. Small bowel obstruction suspected on recent CT Abdomen and Pelvis. Initial encounter. EXAM: ABDOMEN - 2 VIEW COMPARISON:  12/06/2015 and earlier. FINDINGS: Upright and supine views of the abdomen. Enteric tube remains in place, side hole the level of the gastric body. Minimally changed appearance of multiple mid abdominal air in fluid containing small bowel loops. Fluid levels persist on upright images. Paucity of colonic gas, but the large bowel appears largely decompressed as seen on the recent CT. Overall, the gas pattern has not significantly improved since 12/04/2015. No pneumoperitoneum. Stable lung bases. No acute osseous abnormality identified. IMPRESSION: 1. Enteric tube remains in place, side hole to level of the gastric body. 2. No significant improvement in bowel gas pattern since 12/04/2015, suggesting small bowel obstruction or small bowel ileus. 3. No free air. Electronically Signed   By: Genevie Ann M.D.   On: 12/07/2015 09:02   Dg Abd 2 Views  12/06/2015  CLINICAL DATA:  Abdominal pain, mild small bowel dilatation on recent CT examination.  EXAM: ABDOMEN - 2 VIEW COMPARISON:  12/05/2015 FINDINGS: Nasogastric catheter is noted within the distal stomach. Few scattered loops of mildly dilated small bowel are noted. No free air is seen. No new obstructive changes are noted. IMPRESSION: Few loops of mildly dilated small bowel not significantly changed from previous exam. Electronically Signed   By: Inez Catalina M.D.   On: 12/06/2015 07:40   Dg Abd Portable 1v  12/08/2015  CLINICAL DATA:  Followup small bowel obstruction. EXAM: PORTABLE ABDOMEN - 1 VIEW COMPARISON:  12/07/2015 FINDINGS: Dilated loops of small bowel appear mildly increased in diameter when compared to the previous day's study. There is some stool and air in a nondilated colon. Nasal/orogastric tube is well positioned with its tip in the distal stomach. IMPRESSION: 1. Small bowel dilation appears increased in severity from the previous day's study. Findings consistent with a persistent partial small bowel obstruction. Electronically Signed   By: Lajean Manes M.D.   On: 12/08/2015 11:36   Dg Abd Portable 1v-small Bowel Obstruction Protocol-initial, 8 Hr Delay  12/05/2015  CLINICAL DATA:  Followup small bowel obstruction. EXAM: PORTABLE ABDOMEN - 1 VIEW COMPARISON:  12/05/2015 FINDINGS: Nasogastric tube is again seen with tip in proximal stomach. Oral contrast remains within the stomach. Multiple mildly dilated small bowel loops are again seen in the mid and lower abdomen, without significant change. No significant distal progression of oral contrast material is seen. The The colon remains nondilated. These findings are suspicious for partial small bowel obstruction. IMPRESSION: Findings suspicious for partial small bowel obstruction. Electronically Signed   By: Earle Gell M.D.   On: 12/05/2015 21:05   Dg Abd Portable 1v-small Bowel Protocol-position Verification  12/05/2015  CLINICAL DATA:  Encounter for nasogastric tube placement EXAM: PORTABLE ABDOMEN - 1 VIEW COMPARISON:  Portable  exam 0955 hours compared to CT abdomen and pelvis 12/04/2015 FINDINGS: Nasogastric tube coiled in proximal stomach. Normal bowel gas pattern. Few air-filled nondistended loops of small bowel in mid abdomen. Excreted contrast material within renal collecting systems and bladder. No bowel dilatation or bowel wall thickening. Bones appear demineralized. IMPRESSION: Nasogastric tube coiled in proximal stomach. Electronically Signed   By: Lavonia Dana M.D.   On: 12/05/2015 10:23    Microbiology: Recent Results (from the past 240 hour(s))  Surgical pcr screen     Status: None   Collection Time: 12/08/15 10:44 PM  Result Value Ref Range Status   MRSA, PCR NEGATIVE NEGATIVE Final   Staphylococcus aureus NEGATIVE NEGATIVE Final    Comment:        The Xpert SA Assay (FDA approved for NASAL specimens in patients over 68 years of age), is one component of a comprehensive surveillance program.  Test performance has been validated by [facility] for patients greater than or equal to 23 year old. It is not intended to diagnose infection nor to guide or monitor treatment.      Labs: Basic Metabolic Panel:  Recent Labs Lab 12/09/15 1440 12/10/15 0553  NA  --  143  K  --  3.9  CL  --  106  CO2  --  25  GLUCOSE  --  98  BUN  --  9  CREATININE 0.76 0.74  CALCIUM  --  8.0*  MG  --  1.7   Liver Function Tests: No results for input(s): AST, ALT, ALKPHOS, BILITOT, PROT, ALBUMIN in the last 168 hours. No results for input(s): LIPASE, AMYLASE in the last 168 hours. No results for input(s): AMMONIA in the last 168 hours. CBC:  Recent Labs Lab 12/12/15 0455 12/13/15 0640 12/14/15 0444 12/15/15 0445 12/16/15 0443  WBC 9.8 8.0 7.7 6.3 8.8  HGB 12.9 11.7* 12.3 12.0 13.4  HCT 40.2 37.3 38.4 37.6 41.1  MCV 97.8 98.2 96.2 95.9 96.0  PLT 253 255 265 277 313   Cardiac Enzymes: No results for input(s): CKTOTAL, CKMB, CKMBINDEX, TROPONINI in the last 168 hours. BNP: BNP (last 3 results) No  results for input(s): BNP in the last 8760 hours.  ProBNP (last 3 results) No results for input(s): PROBNP in the last 8760 hours.  CBG: No results for input(s): GLUCAP in the last 168 hours.  Principal Problem:   Small bowel obstruction-surgery 12/09/15 Active Problems:   Hypothyroidism-(TSH WNL)   Atrial fibrillation, new onset ([place])   Time coordinating discharge: <30 mins   Signed:  Sandford Diop, ANP-BC

## 2015-12-30 NOTE — Progress Notes (Signed)
Cardiology Office Note   Date:  12/31/2015   ID:  Tracy [name], DOB [birth year], MRN [id number]  PCP:  Aretta Nip, MD  Cardiologist:  Dr. Johnsie Cancel  Chief Complaint  Patient presents with  . Follow-up    seen for Dr. Johnsie Cancel      History of Present Illness: Tracy [name] is a 75 y.o. female who presents for post hospital followup. She initially presented on 12/05/2015 with abdominal pain and nausea. CT suggestive of small bowel obstruction. She underwent diagnostic laparoscopy and lysis of adhesion on 3/9. Post procedure, cardiology has been consulted for atrial fibrillation. Looking back she is admitting EKG on 3/5 already shows atrial fibrillation. TSH was normal on 3/5. Eliquis 5 mg twice a day was started on 12/14/2015 after she was able to tolerate PO med and cleared surgery from bleeding perspective. Echocardiogram obtained on 12/15/2015 showed EF 55-60%, normal left atrial size. She was eventually discharged on the same day.   She has been doing well since left the hospital, she denies any chest discomfort or shortness breath. She is having good bowel movement without further nausea or vomiting after eating. She does use a combination of Miralax and Colace. She has occasional diarrhea after taking too much Miralax and Colace. Otherwise she has no cardiac awareness of atrial fibrillation. She does complain of some chills and fatigue, however no fever. She presents today for cardiology follow-up. Her incision site looks well without significant redness or bleeding. EKG shows she is has been converted back to normal sinus rhythm. We will obtain CBC today to make sure she has not had any significant bleeding on eliquis. As for her fatigue and chills, I have told her to monitor for any signs of infection, given the fact that she is new on beta blocker and her blood pressure is borderline around 100, I will decrease her metoprolol from 25 mg twice a day down to 12.5 mg twice a day. I will  arrange for her to follow-up with Dr. Johnsie Cancel in 3 months.     Past Medical History  Diagnosis Date  . Mitral valve prolapse   . GERD (gastroesophageal reflux disease)   . Graves disease   . Hyperlipidemia   . Rosacea   . Hypothyroidism   . Colon polyps   . Duodenal adenoma   . LPRD (laryngopharyngeal reflux disease)   . Atrial fibrillation [facility]) March 2017    unknown duration. CHADs VASc =3    Past Surgical History  Procedure Laterality Date  . Cataract extraction, bilateral    . Ruptured disk    . Pilonidal cyst / sinus excision    . Tubal ligation    . Colonoscopy      x2  . Esophagogastroduodenoscopy      x2  . Appendectomy    . Laparotomy N/A 12/09/2015    Procedure: EXPLORATORY LAPAROTOMY, LYSIS OF ADHESIONS ;  Surgeon: [name], MD;  Location: [place];  Service: General;  Laterality: N/A;     Current Outpatient Prescriptions  Medication Sig Dispense Refill  . acetaminophen (TYLENOL) 500 MG tablet Take 1,000 mg by mouth every 6 (six) hours as needed (pain).    Marland Kitchen apixaban (ELIQUIS) 5 MG TABS tablet Take 1 tablet (5 mg total) by mouth 2 (two) times daily. 60 tablet 11  . Calcium Carbonate-Vitamin D (CALCIUM-VITAMIN D) 600-200 MG-UNIT CAPS Take 1 tablet by mouth 2 (two) times daily.     Marland Kitchen docusate sodium (COLACE) 100 MG  capsule Take 1 capsule (100 mg total) by mouth 2 (two) times daily. 10 capsule 0  . ezetimibe-simvastatin (VYTORIN) 10-40 MG per tablet Take 1 tablet by mouth at bedtime.     Marland Kitchen levothyroxine (SYNTHROID, LEVOTHROID) 75 MCG tablet Take 75 mcg by mouth daily.    . metoprolol tartrate (LOPRESSOR) 25 MG tablet Take 1 tablet (25 mg total) by mouth 2 (two) times daily. 60 tablet 0  . montelukast (SINGULAIR) 10 MG tablet Take 10 mg by mouth at bedtime as needed (seasonal allergies).   8  . Multiple Vitamin (MULTIVITAMIN WITH MINERALS) TABS tablet Take 1 tablet by mouth daily.    . polyethylene glycol (MIRALAX / GLYCOLAX) packet Take 17 g by mouth daily. 14  each 0  . ranitidine (ZANTAC) 300 MG tablet Take 300 mg by mouth 2 (two) times daily as needed for heartburn (acid reflux).     Marland Kitchen Spacer/Aero-Holding Chambers (AEROCHAMBER MV) inhaler Use as instructed 1 each 0   No current facility-administered medications for this visit.    Allergies:   Adhesive; Avelox; Tussionex pennkinetic er; Codeine; and Penicillins    Social History:  The patient  reports that she quit smoking about 34 years ago. Her smoking use included Cigarettes. She has a 22.5 pack-year smoking history. She has never used smokeless tobacco. She reports that she drinks alcohol. She reports that she does not use illicit drugs.   Family History:  The patient's family history includes Cancer in her brother, father, mother, and paternal grandfather; Diabetes in her paternal grandmother; Heart attack in her maternal grandfather; Kidney failure in her paternal grandmother.    ROS:  Please see the history of present illness.   Otherwise, review of systems are positive for fatigue and chills but no fever.   All other systems are reviewed and negative.    PHYSICAL EXAM: VS:  Ht 5\' 4"  (1.626 m)  Wt 152 lb (68.947 kg)  BMI 26.08 kg/m2 , BMI Body mass index is 26.08 kg/(m^2). GEN: Well nourished, well developed, in no acute distress HEENT: normal Neck: no JVD, carotid bruits, or masses Cardiac: RRR; no murmurs, rubs, or gallops,no edema  Respiratory:  clear to auscultation bilaterally, normal work of breathing GI: soft, nontender, nondistended, + BS.  lower abdominal midline incisional scar well healed, no open wound, no drainage, no redness, Steri-Strips in place. MS: no deformity or atrophy Skin: warm and dry, no rash Neuro:  Strength and sensation are intact Psych: euthymic mood, full affect   EKG:  EKG is ordered today. The ekg ordered today demonstrates NSR without significant ST-T wave changes.   Recent Labs: 12/05/2015: ALT 16; TSH 0.357 12/10/2015: BUN 9; Creatinine, Ser  0.74; Magnesium 1.7; Potassium 3.9; Sodium 143 12/16/2015: Hemoglobin 13.4; Platelets 313    Lipid Panel No results found for: CHOL, TRIG, HDL, CHOLHDL, VLDL, LDLCALC, LDLDIRECT    Wt Readings from Last 3 Encounters:  12/31/15 152 lb (68.947 kg)  12/05/15 160 lb 4.8 oz (72.712 kg)  05/30/13 163 lb (73.936 kg)      Other studies Reviewed: Additional studies/ records that were reviewed today include:   Echo 12/15/2015 LV EF: 55% - 60%  ------------------------------------------------------------------- Indications: Atrial fibrillation - 427.31.  ------------------------------------------------------------------- History: PMH: No prior cardiac history.  ------------------------------------------------------------------- Study Conclusions  - Left ventricle: The cavity size was normal. Systolic function was  normal. The estimated ejection fraction was in the range of 55%  to 60%. Wall motion was normal; there were no regional wall  motion  abnormalities. - Aortic valve: Transvalvular velocity was within the normal range.  There was no stenosis. There was no regurgitation. - Mitral valve: There was trivial regurgitation. - Right ventricle: The cavity size was normal. Wall thickness was  normal. Systolic function was normal. - Tricuspid valve: There was no regurgitation. - Inferior vena cava: The vessel was normal in size. The  respirophasic diameter changes were in the normal range (>= 50%),  consistent with normal central venous pressure. - Pericardium, extracardiac: A trivial pericardial effusion was  identified. Features were not consistent with tamponade  physiology.   Review of the above records demonstrates:   Recently admitted for small bowel obstruction and noted to have atrial fibrillation of unknown duration prior to admission. Cartilage was consulted. After lysis of adhesion and resolution of small bowel obstruction, she was started on  eliquis 5 mg twice a day on 12/14/2015.   ASSESSMENT AND PLAN:  1.  Atrial fibrillation of unknown duration on eliquis  - CHA2DS2-Vasc score 3 (age, female)  - Continue on eliquis, no need for DC cardioversion as patient has converted to normal sinus rhythm. She is feeling some fatigue and chills, however no obvious sign of fever, given borderline systolic blood pressure, will decrease metoprolol from 25 mg twice a day down to 12.5 mg twice a day. Follow-up in 3 month. Check CBC today to make sure there is no sign of bleeding  Especially given her chills and fatigue.   2. Hyperlipidemia: Continue Vytorin  3. Recent small bowel obstruction: Status post lysis of adhesion during recent hospitalization. Lower abdominal midline incisional scar looks well-healed. No sign of obvious localizing infection. Incisional wound well-healed. She is going to follow-up with her surgeon next week.     Current medicines are reviewed at length with the patient today.  The patient does not have concerns regarding medicines.  The following changes have been made:  decreasing metoprolol dose from 25 mg twice a day down to 12.5 mg twice a day.   Labs/ tests ordered today include: No orders of the defined types were placed in this encounter.     Disposition:   FU with Dr. Johnsie Cancel in 3 months  Signed, Almyra Deforest, [place]  12/31/2015 9:20 AM    [address], [place], [place]  [address] Phone: [phone]; Fax: [phone]

## 2015-12-31 ENCOUNTER — Encounter: Payer: Self-pay | Admitting: Physician Assistant

## 2015-12-31 ENCOUNTER — Ambulatory Visit (INDEPENDENT_AMBULATORY_CARE_PROVIDER_SITE_OTHER): Payer: Medicare Other | Admitting: Physician Assistant

## 2015-12-31 VITALS — BP 100/66 | HR 56 | Ht 64.0 in | Wt 152.0 lb

## 2015-12-31 DIAGNOSIS — Z79899 Other long term (current) drug therapy: Secondary | ICD-10-CM

## 2015-12-31 DIAGNOSIS — I4891 Unspecified atrial fibrillation: Secondary | ICD-10-CM | POA: Diagnosis not present

## 2015-12-31 LAB — CBC
HCT: 41.4 % (ref 36.0–46.0)
Hemoglobin: 13.9 g/dL (ref 12.0–15.0)
MCH: 31.5 pg (ref 26.0–34.0)
MCHC: 33.6 g/dL (ref 30.0–36.0)
MCV: 93.9 fL (ref 78.0–100.0)
MPV: 9.3 fL (ref 8.6–12.4)
PLATELETS: 261 10*3/uL (ref 150–400)
RBC: 4.41 MIL/uL (ref 3.87–5.11)
RDW: 13.1 % (ref 11.5–15.5)
WBC: 6.8 10*3/uL (ref 4.0–10.5)

## 2015-12-31 MED ORDER — METOPROLOL TARTRATE 25 MG PO TABS: 12.5000 mg | ORAL_TABLET | Freq: Two times a day (BID) | ORAL | Status: DC

## 2015-12-31 NOTE — Patient Instructions (Signed)
Medication Instructions:  Please decrease your Metoprolol to 12.5 mg (25 mg 1/2 tablet) twice a day. Continue all other medications as listed.  Labwork: Please have blood work today (CBC)  Follow-Up: Follow up in 3 months with Dr Johnsie Cancel.  If you need a refill on your cardiac medications before your next appointment, please call your pharmacy.  Thank you for choosing [facility]!!

## 2016-01-10 ENCOUNTER — Other Ambulatory Visit: Payer: Self-pay | Admitting: *Deleted

## 2016-01-10 MED ORDER — METOPROLOL TARTRATE 25 MG PO TABS: 12.5000 mg | ORAL_TABLET | Freq: Two times a day (BID) | ORAL | Status: DC

## 2016-01-12 ENCOUNTER — Telehealth: Payer: Self-pay

## 2016-01-12 NOTE — Telephone Encounter (Signed)
Prior auth for Eliquis 5 mg submitted to [facility]. 

## 2016-01-14 ENCOUNTER — Telehealth: Payer: Self-pay

## 2016-01-14 NOTE — Telephone Encounter (Signed)
Prior auth obtained for Eliquis, and is good through 10/01/2016. [id number].

## 2016-04-04 NOTE — Progress Notes (Signed)
Cardiology Office Note   Date:  04/06/2016   ID:  Tracy [name], DOB 1940/12/13, MRN [id number]  PCP:  Tracy Solo, MD  Cardiologist:  Dr. Johnsie Cancel  Chief Complaint  Patient presents with  . Atrial Fibrillation    if she moves to fast she will get dizzy, per pt      History of Present Illness: Tracy [name] is a 75 y.o. female who presents for f/u afib.. She initially presented on 12/05/2015 with abdominal pain and nausea. CT suggestive of small bowel obstruction. She underwent diagnostic laparoscopy and lysis of adhesion on 3/9. Post procedure, cardiology has been consulted for atrial fibrillation. Looking back she is admitting EKG on 3/5 already shows atrial fibrillation. TSH was normal on 3/5. Eliquis 5 mg twice a day was started on 12/14/2015 after she was able to tolerate PO med and cleared surgery from bleeding perspective. Echocardiogram obtained on 12/15/2015 showed EF 55-60%, normal left atrial size. She was eventually discharged on the same day.   Seen by PA 12/31/15 and beta blocker dose decreased She had spontaneously converted to NSR   Stress from children. Daughter has a special needs child and son Has agoraphobia and having trouble with no work for 2 years  Past Medical History  Diagnosis Date  . Mitral valve prolapse   . GERD (gastroesophageal reflux disease)   . Graves disease   . Hyperlipidemia   . Rosacea   . Hypothyroidism   . Colon polyps   . Duodenal adenoma   . LPRD (laryngopharyngeal reflux disease)   . Atrial fibrillation [facility]) March 2017    unknown duration. CHADs VASc =3    Past Surgical History  Procedure Laterality Date  . Cataract extraction, bilateral    . Ruptured disk    . Pilonidal cyst / sinus excision    . Tubal ligation    . Colonoscopy      x2  . Esophagogastroduodenoscopy      x2  . Appendectomy    . Laparotomy N/A 12/09/2015    Procedure: EXPLORATORY LAPAROTOMY, LYSIS OF ADHESIONS ;  Surgeon: [name], MD;  Location: [place];  Service: General;  Laterality: N/A;     Current Outpatient Prescriptions  Medication Sig Dispense Refill  . acetaminophen (TYLENOL) 500 MG tablet Take 1,000 mg by mouth every 6 (six) hours as needed (pain).    Marland Kitchen apixaban (ELIQUIS) 5 MG TABS tablet Take 1 tablet (5 mg total) by mouth 2 (two) times daily. 60 tablet 11  . Calcium Carbonate-Vitamin D (CALCIUM-VITAMIN D) 600-200 MG-UNIT CAPS Take 1 tablet by mouth 2 (two) times daily.     Marland Kitchen docusate sodium (COLACE) 100 MG capsule Take 1 capsule (100 mg total) by mouth 2 (two) times daily. 10 capsule 0  . ezetimibe-simvastatin (VYTORIN) 10-40 MG per tablet Take 1 tablet by mouth at bedtime.     Marland Kitchen levothyroxine (SYNTHROID, LEVOTHROID) 75 MCG tablet Take 75 mcg by mouth daily.    . metoprolol tartrate (LOPRESSOR) 25 MG tablet Take 0.5 tablets (12.5 mg total) by mouth 2 (two) times daily. 60 tablet 3  . montelukast (SINGULAIR) 10 MG tablet Take 10 mg by mouth at bedtime as needed (seasonal allergies).   8  . Multiple Vitamin (MULTIVITAMIN WITH MINERALS) TABS tablet Take 1 tablet by mouth daily.    . polyethylene glycol (MIRALAX / GLYCOLAX) packet Take 17 g by mouth daily as needed for severe constipation.    . ranitidine (ZANTAC) 300 MG tablet  Take 300 mg by mouth 2 (two) times daily as needed for heartburn (acid reflux).     Marland Kitchen Spacer/Aero-Holding Chambers (AEROCHAMBER MV) inhaler Use as instructed 1 each 0   No current facility-administered medications for this visit.    Allergies:   Adhesive; Avelox; Tussionex pennkinetic er; Codeine; and Penicillins    Social History:  The patient  reports that she quit smoking about 34 years ago. Her smoking use included Cigarettes. She has a 22.5 pack-year smoking history. She has never used smokeless tobacco. She reports that she drinks alcohol. She reports that she does not use illicit drugs.   Family History:  The patient's family history includes Cancer in her brother, father, mother, and paternal  grandfather; Diabetes in her paternal grandmother; Heart attack in her maternal grandfather; Kidney failure in her paternal grandmother.    ROS:  Please see the history of present illness.   Otherwise, review of systems are positive for fatigue and chills but no fever.   All other systems are reviewed and negative.    PHYSICAL EXAM: VS:  BP 130/60 mmHg  Pulse 59  Ht 5\' 4"  (1.626 m)  Wt 159 lb 6.4 oz (72.303 kg)  BMI 27.35 kg/m2  SpO2 98% , BMI Body mass index is 27.35 kg/(m^2). GEN: Well nourished, well developed, in no acute distress HEENT: normal Neck: no JVD, carotid bruits, or masses Cardiac: RRR; no murmurs, rubs, or gallops,no edema  Respiratory:  clear to auscultation bilaterally, normal work of breathing GI: soft, nontender, nondistended, + BS.  lower abdominal midline incisional scar well healed, no open wound, no drainage, no redness, Steri-Strips in place. MS: no deformity or atrophy Skin: warm and dry, no rash Neuro:  Strength and sensation are intact Psych: euthymic mood, full affect   EKG:  12/31/15  NSR without significant ST-T wave changes.   Recent Labs: 12/05/2015: ALT 16; TSH 0.357 12/10/2015: BUN 9; Creatinine, Ser 0.74; Magnesium 1.7; Potassium 3.9; Sodium 143 12/31/2015: Hemoglobin 13.9; Platelets 261    Lipid Panel No results found for: CHOL, TRIG, HDL, CHOLHDL, VLDL, LDLCALC, LDLDIRECT    Wt Readings from Last 3 Encounters:  04/06/16 159 lb 6.4 oz (72.303 kg)  12/31/15 152 lb (68.947 kg)  12/05/15 160 lb 4.8 oz (72.712 kg)      Other studies Reviewed: Additional studies/ records that were reviewed today include:   Echo 12/15/2015 LV EF: 55% - 60%  ------------------------------------------------------------------- Indications: Atrial fibrillation - 427.31.  ------------------------------------------------------------------- History: PMH: No prior cardiac  history.  ------------------------------------------------------------------- Study Conclusions  - Left ventricle: The cavity size was normal. Systolic function was  normal. The estimated ejection fraction was in the range of 55%  to 60%. Wall motion was normal; there were no regional wall  motion abnormalities. - Aortic valve: Transvalvular velocity was within the normal range.  There was no stenosis. There was no regurgitation. - Mitral valve: There was trivial regurgitation. - Right ventricle: The cavity size was normal. Wall thickness was  normal. Systolic function was normal. - Tricuspid valve: There was no regurgitation. - Inferior vena cava: The vessel was normal in size. The  respirophasic diameter changes were in the normal range (>= 50%),  consistent with normal central venous pressure. - Pericardium, extracardiac: A trivial pericardial effusion was  identified. Features were not consistent with tamponade  physiology.   Review of the above records demonstrates:   Recently admitted for small bowel obstruction and noted to have atrial fibrillation of unknown duration prior to admission. Cartilage was  consulted. After lysis of adhesion and resolution of small bowel obstruction, she was started on eliquis 5 mg twice a day on 12/14/2015.   ASSESSMENT AND PLAN:  1.  Atrial fibrillation of unknown duration on eliquis  - CHA2DS2-Vasc score 3 (age, female)  - Continue on eliquis, Converted to normal sinus rhythm.   2. Hyperlipidemia: Continue Vytorin  3. Small bowel obstruction: Status post lysis of adhesion during  hospitalization. Lower abdominal midline incisional scar looks well-healed.   4. Hypothyroidism:  Continue current replacement dose  Lab Results  Component Value Date   TSH 0.357 12/05/2015      Jenkins Rouge

## 2016-04-06 ENCOUNTER — Ambulatory Visit (INDEPENDENT_AMBULATORY_CARE_PROVIDER_SITE_OTHER): Payer: Medicare Other | Admitting: Cardiovascular Disease

## 2016-04-06 ENCOUNTER — Encounter: Payer: Self-pay | Admitting: Cardiovascular Disease

## 2016-04-06 VITALS — BP 130/60 | HR 59 | Ht 64.0 in | Wt 159.4 lb

## 2016-04-06 DIAGNOSIS — I4891 Unspecified atrial fibrillation: Secondary | ICD-10-CM | POA: Diagnosis not present

## 2016-04-06 NOTE — Patient Instructions (Signed)

## 2016-07-19 ENCOUNTER — Other Ambulatory Visit: Payer: Self-pay | Admitting: Cardiology

## 2016-07-20 NOTE — Telephone Encounter (Signed)
REFILL 

## 2016-10-06 NOTE — Progress Notes (Signed)
Cardiology Office Note   Date:  10/11/2016   ID:  Tracy [name], DOB May 24, 1941, MRN [id number]  PCP:  [name], MD  Cardiologist:  Dr. Johnsie Cancel  Chief Complaint  Patient presents with  . Atrial Fibrillation      History of Present Illness: Tracy [name] is a 76 y.o. female who presents for f/u afib.. She initially presented on 12/05/2015 with abdominal pain and nausea. CT suggestive of small bowel obstruction. She underwent diagnostic laparoscopy and lysis of adhesion on 12/09/15. Post procedure, cardiology has been consulted for atrial fibrillation. Looking back she is admitting EKG on 3/5 already shows atrial fibrillation. TSH was normal on 3/5. Eliquis 5 mg twice a day was started on 12/14/2015 after she was able to tolerate PO med and cleared surgery from bleeding perspective. Echocardiogram obtained on 12/15/2015 showed EF 55-60%, normal left atrial size. She was eventually discharged on the same day.   Seen by PA 12/31/15 and beta blocker dose decreased She had spontaneously converted to NSR   Stress from children. Daughter has a special needs child and son Has agoraphobia and having trouble with no work for 2 years  Past Medical History:  Diagnosis Date  . Atrial fibrillation [facility]) March 2017   unknown duration. CHADs VASc =3  . Colon polyps   . Duodenal adenoma   . GERD (gastroesophageal reflux disease)   . Graves disease   . Hyperlipidemia   . Hypothyroidism   . LPRD (laryngopharyngeal reflux disease)   . Mitral valve prolapse   . Rosacea     Past Surgical History:  Procedure Laterality Date  . APPENDECTOMY    . CATARACT EXTRACTION, BILATERAL    . COLONOSCOPY     x2  . ESOPHAGOGASTRODUODENOSCOPY     x2  . LAPAROTOMY N/A 12/09/2015   Procedure: EXPLORATORY LAPAROTOMY, LYSIS OF ADHESIONS ;  Surgeon: [name], MD;  Location: [facility];  Service: General;  Laterality: N/A;  . PILONIDAL CYST / SINUS EXCISION    . ruptured disk    . TUBAL LIGATION        Current Outpatient Prescriptions  Medication Sig Dispense Refill  . acetaminophen (TYLENOL) 500 MG tablet Take 1,000 mg by mouth every 6 (six) hours as needed (pain).    Marland Kitchen apixaban (ELIQUIS) 5 MG TABS tablet Take 1 tablet (5 mg total) by mouth 2 (two) times daily. 60 tablet 11  . Calcium Carbonate-Vitamin D (CALCIUM-VITAMIN D) 600-200 MG-UNIT CAPS Take 1 tablet by mouth 2 (two) times daily.     Marland Kitchen docusate sodium (COLACE) 100 MG capsule Take 1 capsule (100 mg total) by mouth 2 (two) times daily. 10 capsule 0  . ezetimibe-simvastatin (VYTORIN) 10-40 MG per tablet Take 1 tablet by mouth at bedtime.     Marland Kitchen levothyroxine (SYNTHROID, LEVOTHROID) 75 MCG tablet Take 75 mcg by mouth daily.    . metoprolol tartrate (LOPRESSOR) 25 MG tablet TAKE 1/2 TABLET (12.5 MG TOTAL) BY MOUTH 2 TIMES DAILY. 90 tablet 1  . montelukast (SINGULAIR) 10 MG tablet Take 10 mg by mouth at bedtime as needed (seasonal allergies).   8  . Multiple Vitamin (MULTIVITAMIN WITH MINERALS) TABS tablet Take 1 tablet by mouth daily.    . polyethylene glycol (MIRALAX / GLYCOLAX) packet Take 17 g by mouth daily as needed for severe constipation.    . ranitidine (ZANTAC) 300 MG tablet Take 300 mg by mouth 2 (two) times daily as needed for heartburn (acid reflux).     Marland Kitchen  Spacer/Aero-Holding Chambers (AEROCHAMBER MV) inhaler Use as instructed 1 each 0   No current facility-administered medications for this visit.     Allergies:   Adhesive [tape]; Avelox [moxifloxacin hcl in nacl]; Tussionex pennkinetic er [employer] er]; Codeine; and Penicillins    Social History:  The patient  reports that she quit smoking about 34 years ago. Her smoking use included Cigarettes. She has a 22.50 pack-year smoking history. She has never used smokeless tobacco. She reports that she drinks alcohol. She reports that she does not use drugs.   Family History:  The patient's family history includes Cancer in her brother, father, mother, and  paternal grandfather; Diabetes in her paternal grandmother; Heart attack in her maternal grandfather; Kidney failure in her paternal grandmother.    ROS:  Please see the history of present illness.   Otherwise, review of systems are positive for fatigue and chills but no fever.   All other systems are reviewed and negative.    PHYSICAL EXAM: VS:  BP (!) 112/54   Pulse (!) 57   Ht 5\' 4"  (1.626 m)   Wt 167 lb 1.9 oz (75.8 kg)   SpO2 97%   BMI 28.69 kg/m  , BMI Body mass index is 28.69 kg/m. GEN: Well nourished, well developed, in no acute distress HEENT: normal Neck: no JVD, carotid bruits, or masses Cardiac: RRR; no murmurs, rubs, or gallops,no edema  Respiratory:  clear to auscultation bilaterally, normal work of breathing GI: soft, nontender, nondistended, + BS.  lower abdominal midline incisional scar well healed, no open wound, no drainage, no redness, Steri-Strips in place. MS: no deformity or atrophy Skin: warm and dry, no rash Neuro:  Strength and sensation are intact Psych: euthymic mood, full affect   EKG:  12/31/15  NSR without significant ST-T wave changes.   Recent Labs: 12/05/2015: ALT 16; TSH 0.357 12/10/2015: BUN 9; Creatinine, Ser 0.74; Magnesium 1.7; Potassium 3.9; Sodium 143 12/31/2015: Hemoglobin 13.9; Platelets 261    Lipid Panel No results found for: CHOL, TRIG, HDL, CHOLHDL, VLDL, LDLCALC, LDLDIRECT    Wt Readings from Last 3 Encounters:  10/11/16 167 lb 1.9 oz (75.8 kg)  04/06/16 159 lb 6.4 oz (72.3 kg)  12/31/15 152 lb (68.9 kg)      Other studies Reviewed: Additional studies/ records that were reviewed today include:   Echo 12/15/2015 LV EF: 55% - 60%  ------------------------------------------------------------------- Indications: Atrial fibrillation - 427.31.  ------------------------------------------------------------------- History: PMH: No prior cardiac  history.  ------------------------------------------------------------------- Study Conclusions  - Left ventricle: The cavity size was normal. Systolic function was  normal. The estimated ejection fraction was in the range of 55%  to 60%. Wall motion was normal; there were no regional wall  motion abnormalities. - Aortic valve: Transvalvular velocity was within the normal range.  There was no stenosis. There was no regurgitation. - Mitral valve: There was trivial regurgitation. - Right ventricle: The cavity size was normal. Wall thickness was  normal. Systolic function was normal. - Tricuspid valve: There was no regurgitation. - Inferior vena cava: The vessel was normal in size. The  respirophasic diameter changes were in the normal range (>= 50%),  consistent with normal central venous pressure. - Pericardium, extracardiac: A trivial pericardial effusion was  identified. Features were not consistent with tamponade  physiology.   Review of the above records demonstrates:   Recently admitted for small bowel obstruction and noted to have atrial fibrillation of unknown duration prior to admission. Cartilage was consulted. After lysis of  adhesion and resolution of small bowel obstruction, she was started on eliquis 5 mg twice a day on 12/14/2015.   ASSESSMENT AND PLAN:  1.  Atrial fibrillation of unknown duration on eliquis  - CHA2DS2-Vasc score 3 (age, female)  - Continue on eliquis, Converted to normal sinus rhythm.   2. Hyperlipidemia: Continue Vytorin  3. Small bowel obstruction: Status post lysis of adhesion during  hospitalization. Lower abdominal midline incisional scar looks well-healed.   4. Hypothyroidism:  Continue current replacement dose  Lab Results  Component Value Date   TSH 0.357 12/05/2015      Jenkins Rouge

## 2016-10-11 ENCOUNTER — Encounter: Payer: Self-pay | Admitting: Cardiovascular Disease

## 2016-10-11 ENCOUNTER — Ambulatory Visit (INDEPENDENT_AMBULATORY_CARE_PROVIDER_SITE_OTHER): Payer: Medicare Other | Admitting: Cardiovascular Disease

## 2016-10-11 VITALS — BP 112/54 | HR 57 | Ht 64.0 in | Wt 167.1 lb

## 2016-10-11 DIAGNOSIS — I4891 Unspecified atrial fibrillation: Secondary | ICD-10-CM

## 2016-10-11 NOTE — Patient Instructions (Addendum)

## 2017-01-02 ENCOUNTER — Other Ambulatory Visit: Payer: Self-pay | Admitting: Cardiology

## 2017-01-15 ENCOUNTER — Other Ambulatory Visit: Payer: Self-pay | Admitting: Cardiology

## 2017-07-02 ENCOUNTER — Other Ambulatory Visit: Payer: Self-pay | Admitting: Cardiology

## 2017-07-19 ENCOUNTER — Other Ambulatory Visit: Payer: Self-pay | Admitting: Cardiology

## 2017-07-19 NOTE — Telephone Encounter (Signed)
REFILL 

## 2017-10-17 ENCOUNTER — Other Ambulatory Visit: Payer: Self-pay | Admitting: Cardiology

## 2017-12-04 NOTE — Progress Notes (Signed)
Cardiology Office Note   Date:  12/06/2017   ID:  Tracy [name], DOB [birth year], MRN [id number]  PCP:  Kathyrn Lass, MD  Cardiologist:  Dr. Johnsie Cancel  No chief complaint on file.     History of Present Illness:  77 y.o. f/u PAF. First seen March 2017 had afib post surgery for SBO and lysis of adhesions. Started on eliquis then Echo 12/15/15 EF 55-60% normal LA size spontaneous conversion and beta blocker dose decreased   Stress from children. Daughter has a special needs child and son Has agoraphobia and having trouble with no work for 3 years  Back In afib in office today Asymptomatic Compliant with meds   Past Medical History:  Diagnosis Date  . Atrial fibrillation [facility]) March 2017   unknown duration. CHADs VASc =3  . Colon polyps   . Duodenal adenoma   . GERD (gastroesophageal reflux disease)   . Graves disease   . Hyperlipidemia   . Hypothyroidism   . LPRD (laryngopharyngeal reflux disease)   . Mitral valve prolapse   . Rosacea     Past Surgical History:  Procedure Laterality Date  . APPENDECTOMY    . CATARACT EXTRACTION, BILATERAL    . COLONOSCOPY     x2  . ESOPHAGOGASTRODUODENOSCOPY     x2  . LAPAROTOMY N/A 12/09/2015   Procedure: EXPLORATORY LAPAROTOMY, LYSIS OF ADHESIONS ;  Surgeon: [name], MD;  Location: [facility];  Service: General;  Laterality: N/A;  . PILONIDAL CYST / SINUS EXCISION    . ruptured disk    . TUBAL LIGATION       Current Outpatient Medications  Medication Sig Dispense Refill  . acetaminophen (TYLENOL) 500 MG tablet Take 1,000 mg by mouth every 6 (six) hours as needed (pain).    Marland Kitchen apixaban (ELIQUIS) 5 MG TABS tablet Take 1 tablet (5 mg total) by mouth 2 (two) times daily. 60 tablet 11  . Calcium Carbonate-Vitamin D (CALCIUM-VITAMIN D) 600-200 MG-UNIT CAPS Take 1 tablet by mouth 2 (two) times daily.     . DimenhyDRINATE (DRAMAMINE PO) Take by mouth as directed. For sleep    . docusate sodium (COLACE) 100 MG capsule Take 1 capsule  (100 mg total) by mouth 2 (two) times daily. 10 capsule 0  . ELIQUIS 5 MG TABS tablet TAKE 1 TABLET BY MOUTH 2 TIMES DAILY. 180 tablet 1  . ezetimibe-simvastatin (VYTORIN) 10-40 MG per tablet Take 1 tablet by mouth at bedtime.     Marland Kitchen levothyroxine (SYNTHROID, LEVOTHROID) 75 MCG tablet Take 75 mcg by mouth daily.    . metoprolol tartrate (LOPRESSOR) 25 MG tablet TAKE 1/2 TABLET BY MOUTH 2 TIMES DAILY. 90 tablet 0  . montelukast (SINGULAIR) 10 MG tablet Take 10 mg by mouth at bedtime as needed (seasonal allergies).   8  . Multiple Vitamin (MULTIVITAMIN WITH MINERALS) TABS tablet Take 1 tablet by mouth daily.    . polyethylene glycol (MIRALAX / GLYCOLAX) packet Take 17 g by mouth daily as needed for severe constipation.    . ranitidine (ZANTAC) 300 MG tablet Take 300 mg by mouth 2 (two) times daily as needed for heartburn (acid reflux).     Marland Kitchen Spacer/Aero-Holding Chambers (AEROCHAMBER MV) inhaler Use as instructed 1 each 0   No current facility-administered medications for this visit.     Allergies:   Adhesive [tape]; Avelox [moxifloxacin hcl in nacl]; Tussionex pennkinetic er [employer] er]; Codeine; and Penicillins    Social History:  The patient  reports that she quit smoking about 35 years ago. Her smoking use included cigarettes. She has a 22.50 pack-year smoking history. she has never used smokeless tobacco. She reports that she drinks alcohol. She reports that she does not use drugs.   Family History:  The patient's family history includes Cancer in her brother, father, mother, and paternal grandfather; Diabetes in her paternal grandmother; Heart attack in her maternal grandfather; Kidney failure in her paternal grandmother.    ROS:  Please see the history of present illness.   Otherwise, review of systems are positive for fatigue and chills but no fever.   All other systems are reviewed and negative.    PHYSICAL EXAM: VS:  BP 122/82   Pulse 78   Ht 5\' 4"  (1.626 m)   Wt  169 lb 4 oz (76.8 kg)   BMI 29.05 kg/m  , BMI Body mass index is 29.05 kg/m. Affect appropriate Healthy:  appears stated age 30: normal Neck supple with no adenopathy JVP normal no bruits no thyromegaly Lungs clear with no wheezing and good diaphragmatic motion Heart:  S1/S2 no murmur, no rub, gallop or click PMI normal Abdomen: benighn, BS positve, no tenderness, no AAA no bruit.  No HSM or HJR Distal pulses intact with no bruits No edema Neuro non-focal Skin warm and dry No muscular weakness    EKG:  12/31/15  NSR without significant ST-T wave changes. 12/06/17  Afib rate 78 othewise normal    Recent Labs: No results found for requested labs within last 8760 hours.    Lipid Panel No results found for: CHOL, TRIG, HDL, CHOLHDL, VLDL, LDLCALC, LDLDIRECT    Wt Readings from Last 3 Encounters:  12/06/17 169 lb 4 oz (76.8 kg)  10/11/16 167 lb 1.9 oz (75.8 kg)  04/06/16 159 lb 6.4 oz (72.3 kg)      Other studies Reviewed: Additional studies/ records that were reviewed today include:   Echo 12/15/2015 LV EF: 55% - 60%  ------------------------------------------------------------------- Indications: Atrial fibrillation - 427.31.  ------------------------------------------------------------------- History: PMH: No prior cardiac history.  ------------------------------------------------------------------- Study Conclusions  - Left ventricle: The cavity size was normal. Systolic function was  normal. The estimated ejection fraction was in the range of 55%  to 60%. Wall motion was normal; there were no regional wall  motion abnormalities. - Aortic valve: Transvalvular velocity was within the normal range.  There was no stenosis. There was no regurgitation. - Mitral valve: There was trivial regurgitation. - Right ventricle: The cavity size was normal. Wall thickness was  normal. Systolic function was normal. - Tricuspid valve: There was no  regurgitation. - Inferior vena cava: The vessel was normal in size. The  respirophasic diameter changes were in the normal range (>= 50%),  consistent with normal central venous pressure. - Pericardium, extracardiac: A trivial pericardial effusion was  identified. Features were not consistent with tamponade  physiology.    ASSESSMENT AND PLAN:  1.  Atrial fibrillation CHA2DS2 3 continue eliquis  And lopressor She is totally unaware of her afib and has normal EF will adopt a strategy of rate control and anticoagulation Discussed With patient and she agrees  2. Hyperlipidemia: LDL at goal continue vytorin   3. Small bowel obstruction: Status post lysis of adhesion improved 4. Hypothyroidism:  Continue current replacement dose  Lab Results  Component Value Date   TSH 0.357 12/05/2015    F/U in a year with echo   Jenkins Rouge

## 2017-12-06 ENCOUNTER — Encounter: Payer: Self-pay | Admitting: Cardiovascular Disease

## 2017-12-06 ENCOUNTER — Ambulatory Visit: Payer: Medicare Other | Admitting: Cardiovascular Disease

## 2017-12-06 VITALS — BP 122/82 | HR 78 | Ht 64.0 in | Wt 169.2 lb

## 2017-12-06 DIAGNOSIS — I4891 Unspecified atrial fibrillation: Secondary | ICD-10-CM | POA: Diagnosis not present

## 2017-12-06 NOTE — Patient Instructions (Addendum)

## 2018-01-02 ENCOUNTER — Other Ambulatory Visit: Payer: Self-pay | Admitting: Cardiology

## 2018-01-02 ENCOUNTER — Other Ambulatory Visit: Payer: Self-pay | Admitting: Cardiovascular Disease

## 2018-01-02 NOTE — Telephone Encounter (Signed)
Eliquis 5mg  refill request received; pt is 77 yrs old, wt-76.8kg, Crea-0.80 on 05/14/17 at PCP office, last seen by Dr. Johnsie Cancel on 12/06/17; will send in refill to requested pharmacy.

## 2018-01-02 NOTE — Telephone Encounter (Signed)
REFILL 

## 2018-04-08 ENCOUNTER — Other Ambulatory Visit: Payer: Self-pay | Admitting: Cardiovascular Disease

## 2018-04-08 NOTE — Telephone Encounter (Signed)
Age 77 years Wt 76.8kg 12/06/2017 Saw Dr Johnsie Cancel on 12/06/2017 Labs done per Dr Reynold Bowen on 03/20/2018  SrCr 0.8 Hgb 13.6 HCT 42.1 Refill done for Eliquis 5mg  q 12 hours as requested

## 2018-04-09 ENCOUNTER — Other Ambulatory Visit: Payer: Self-pay | Admitting: *Deleted

## 2018-10-04 ENCOUNTER — Other Ambulatory Visit: Payer: Self-pay | Admitting: Cardiovascular Disease

## 2018-10-04 NOTE — Telephone Encounter (Signed)
Pt last saw Dr Johnsie Cancel 12/06/17, last labs 03/20/18 Creat 0.8, age 78, weight 76.8kg, based on specified criteria pt is on appropriate dosage of Eliquis 5mg  BID.  Will refill rx.

## 2018-12-11 ENCOUNTER — Ambulatory Visit (HOSPITAL_COMMUNITY): Payer: Medicare Other | Attending: Cardiovascular Disease

## 2018-12-11 ENCOUNTER — Other Ambulatory Visit: Payer: Self-pay

## 2018-12-11 DIAGNOSIS — I4891 Unspecified atrial fibrillation: Secondary | ICD-10-CM | POA: Diagnosis not present

## 2018-12-11 MED ORDER — PERFLUTREN LIPID MICROSPHERE
1.0000 mL | INTRAVENOUS | Status: AC | PRN
Start: 2018-12-11 — End: 2018-12-11
  Administered 2018-12-11 (×2): 2 mL via INTRAVENOUS

## 2019-01-01 ENCOUNTER — Other Ambulatory Visit: Payer: Self-pay | Admitting: Cardiology

## 2019-01-01 NOTE — Telephone Encounter (Signed)
Lopressor refill.

## 2019-01-10 ENCOUNTER — Telehealth: Payer: Self-pay

## 2019-01-10 NOTE — Telephone Encounter (Signed)
Called patient to see if she wanted to come in for a virtual visit for her 1 year f/u. Patient declined stating she would rather see Dr. Johnsie Cancel in person. Patient stated she was doing fine at this time. Encouraged patient to call our office if she had any concerns that came up. Patient verbalized understanding and thanked me for the call.

## 2019-02-07 ENCOUNTER — Telehealth: Payer: Self-pay | Admitting: Cardiology

## 2019-02-07 NOTE — Telephone Encounter (Signed)
New message     Virtual phone visit scheduled for 02-13-19.  The 549 number is a jitterbug phone.  Not sure if that has video capability; pt was not sure either.  Pt does not have a bp machine at home.  Patient gave consent for phone visit.  YOUR CARDIOLOGY TEAM HAS ARRANGED FOR AN E-VISIT FOR YOUR APPOINTMENT - PLEASE REVIEW IMPORTANT INFORMATION BELOW SEVERAL DAYS PRIOR TO YOUR APPOINTMENT  Due to the recent COVID-19 pandemic, we are transitioning in-person office visits to tele-medicine visits in an effort to decrease unnecessary exposure to our patients, their families, and staff. These visits are billed to your insurance just like a normal visit is. We also encourage you to sign up for MyChart if you have not already done so. You will need a smartphone if possible. For patients that do not have this, we can still complete the visit using a regular telephone but do prefer a smartphone to enable video when possible. You may have a family member that lives with you that can help. If possible, we also ask that you have a blood pressure cuff and scale at home to measure your blood pressure, heart rate and weight prior to your scheduled appointment. Patients with clinical needs that need an in-person evaluation and testing will still be able to come to the office if absolutely necessary. If you have any questions, feel free to call our office.     YOUR PROVIDER WILL BE USING THE FOLLOWING PLATFORM TO COMPLETE YOUR VISIT:  Phone   IF USING MYCHART - How to Download the MyChart App to Your SmartPhone   - If Apple, go to [employer] and type in MyChart in the search bar and download the app. If Android, ask patient to go to [employer] and type in [facility] in the search bar and download the app. The app is free but as with any other app downloads, your phone may require you to verify saved payment information or Apple/Android password.  - You will need to then log into the app with your MyChart  username and password, and select [facility] as your healthcare provider to link the account.  - When it is time for your visit, go to the MyChart app, find appointments, and click Begin Video Visit. Be sure to Select Allow for your device to access the Microphone and Camera for your visit. You will then be connected, and your provider will be with you shortly.  **If you have any issues connecting or need assistance, please contact MyChart service desk [phone])**  **If using a computer, in order to ensure the best quality for your visit, you will need to use either of the following Internet Browsers: Insurance underwriter or Microsoft Edge**   IF USING DOXIMITY or [email or web address] - The staff will give you instructions on receiving your link to join the meeting the day of your visit.      2-3 DAYS BEFORE YOUR APPOINTMENT  You will receive a telephone call from one of our [facility] team members - your caller ID may say "Unknown caller." If this is a video visit, we will walk you through how to get the video launched on your phone. We will remind you check your blood pressure, heart rate and weight prior to your scheduled appointment. If you have an Apple Watch or Kardia, please upload any pertinent ECG strips the day before or morning of your appointment to [facility]. Our staff will also make sure you  have reviewed the consent and agree to move forward with your scheduled tele-health visit.     THE DAY OF YOUR APPOINTMENT  Approximately 15 minutes prior to your scheduled appointment, you will receive a telephone call from one of [facility] team - your caller ID may say "Unknown caller."  Our staff will confirm medications, vital signs for the day and any symptoms you may be experiencing. Please have this information available prior to the time of visit start. It may also be helpful for you to have a pad of paper and pen handy for any instructions given during your visit. They will also walk you  through joining the smartphone meeting if this is a video visit.    CONSENT FOR TELE-HEALTH VISIT - PLEASE REVIEW  I hereby voluntarily request, consent and authorize [facility] and its employed or contracted physicians, physician assistants, nurse practitioners or other licensed health care professionals (the Practitioner), to provide me with telemedicine health care services (the Services") as deemed necessary by the treating Practitioner. I acknowledge and consent to receive the Services by the Practitioner via telemedicine. I understand that the telemedicine visit will involve communicating with the Practitioner through live audiovisual communication technology and the disclosure of certain medical information by electronic transmission. I acknowledge that I have been given the opportunity to request an in-person assessment or other available alternative prior to the telemedicine visit and am voluntarily participating in the telemedicine visit.  I understand that I have the right to withhold or withdraw my consent to the use of telemedicine in the course of my care at any time, without affecting my right to future care or treatment, and that the Practitioner or I may terminate the telemedicine visit at any time. I understand that I have the right to inspect all information obtained and/or recorded in the course of the telemedicine visit and may receive copies of available information for a reasonable fee.  I understand that some of the potential risks of receiving the Services via telemedicine include:   Delay or interruption in medical evaluation due to technological equipment failure or disruption;  Information transmitted may not be sufficient (e.g. poor resolution of images) to allow for appropriate medical decision making by the Practitioner; and/or   In rare instances, security protocols could fail, causing a breach of personal health information.  Furthermore, I acknowledge that it is  my responsibility to provide information about my medical history, conditions and care that is complete and accurate to the best of my ability. I acknowledge that Practitioner's advice, recommendations, and/or decision may be based on factors not within their control, such as incomplete or inaccurate data provided by me or distortions of diagnostic images or specimens that may result from electronic transmissions. I understand that the practice of medicine is not an exact science and that Practitioner makes no warranties or guarantees regarding treatment outcomes. I acknowledge that I will receive a copy of this consent concurrently upon execution via email to the email address I last provided but may also request a printed copy by calling the office of [facility].    I understand that my insurance will be billed for this visit.   I have read or had this consent read to me.  I understand the contents of this consent, which adequately explains the benefits and risks of the Services being provided via telemedicine.   I have been provided ample opportunity to ask questions regarding this consent and the Services and have had my questions  answered to my satisfaction.  I give my informed consent for the services to be provided through the use of telemedicine in my medical care  By participating in this telemedicine visit I agree to the above.

## 2019-02-12 NOTE — Progress Notes (Signed)
Virtual Visit via Telephone Note   This visit type was conducted due to national recommendations for restrictions regarding the COVID-19 Pandemic (e.g. social distancing) in an effort to limit this patient's exposure and mitigate transmission in our community.  Due to her co-morbid illnesses, this patient is at least at moderate risk for complications without adequate follow up.  This format is felt to be most appropriate for this patient at this time.  The patient did not have access to video technology/had technical difficulties with video requiring transitioning to audio format only (telephone).  All issues noted in this document were discussed and addressed.  No physical exam could be performed with this format.  Please refer to the patient's chart for her  consent to telehealth for [facility].   Date:  02/13/2019   ID:  Tracy [name], DOB [birth year], MRN [id number]  Patient Location: Home Provider Location: Office  PCP:  Kathyrn Lass, MD  Cardiologist:  Jenkins Rouge, MD  Electrophysiologist:  None   Evaluation Performed:  Follow-Up Visit  Chief Complaint:  Atrial fib   History of Present Illness:    Tracy [name] is a 78 y.o. female with PAF. First seen March 2017 had afib post surgery for SBO and lysis of adhesions. Started on eliquis then Echo 12/15/15 EF 55-60% normal LA size spontaneous conversion and beta blocker dose decreased   Stress from children. Daughter has a special needs child and son Has agoraphobia and having trouble with no work for 3 years  Back In afib in office 12/06/17  Asymptomatic Compliant with meds  so she has remained in a fib.  Rate controlled and anticoagulation.    Labs with PCP LDL 97 and HDL 62 04/2018  Today she has no cardiac complaints. No chest pain or SOB. No fevers, she does have her usual allergies.  She has not been out; her daughter does her shopping for her.  She has no awareness of her HR.  She has on occ. Missed a dose of her eliquis  in the evening.  Questions answered.   The patient does not have symptoms concerning for COVID-19 infection (fever, chills, cough, or new shortness of breath).    Past Medical History:  Diagnosis Date  . Atrial fibrillation [facility]) March 2017   unknown duration. CHADs VASc =3  . Colon polyps   . Duodenal adenoma   . GERD (gastroesophageal reflux disease)   . Graves disease   . Hyperlipidemia   . Hypothyroidism   . LPRD (laryngopharyngeal reflux disease)   . Mitral valve prolapse   . Rosacea    Past Surgical History:  Procedure Laterality Date  . APPENDECTOMY    . CATARACT EXTRACTION, BILATERAL    . COLONOSCOPY     x2  . ESOPHAGOGASTRODUODENOSCOPY     x2  . LAPAROTOMY N/A 12/09/2015   Procedure: EXPLORATORY LAPAROTOMY, LYSIS OF ADHESIONS ;  Surgeon: [name], MD;  Location: [place];  Service: General;  Laterality: N/A;  . PILONIDAL CYST / SINUS EXCISION    . ruptured disk    . TUBAL LIGATION       Current Meds  Medication Sig  . acetaminophen (TYLENOL) 500 MG tablet Take 1,000 mg by mouth every 6 (six) hours as needed (pain).  . Calcium Carbonate-Vitamin D (CALCIUM-VITAMIN D) 600-200 MG-UNIT CAPS Take 1 tablet by mouth 2 (two) times daily.   . DimenhyDRINATE (DRAMAMINE PO) Take by mouth as directed. For sleep  . docusate sodium (COLACE) 100 MG  capsule Take 1 capsule (100 mg total) by mouth 2 (two) times daily.  Marland Kitchen ELIQUIS 5 MG TABS tablet TAKE 1 TABLET BY MOUTH 2 TIMES DAILY.  Marland Kitchen ezetimibe-simvastatin (VYTORIN) 10-40 MG per tablet Take 1 tablet by mouth at bedtime.   . famotidine (PEPCID) 40 MG tablet Take 40 mg by mouth daily.  . Glucosamine-Chondroit-Vit C-Mn (GLUCOSAMINE 1500 COMPLEX PO) Take 1 tablet by mouth 2 (two) times a day.  . metoprolol tartrate (LOPRESSOR) 25 MG tablet TAKE 1/2 TABLET BY MOUTH 2 TIMES DAILY.  . montelukast (SINGULAIR) 10 MG tablet Take 10 mg by mouth at bedtime as needed (seasonal allergies).   . Multiple Vitamin (MULTIVITAMIN WITH MINERALS)  TABS tablet Take 1 tablet by mouth daily.  . polyethylene glycol (MIRALAX / GLYCOLAX) packet Take 17 g by mouth daily as needed for severe constipation.  Marland Kitchen Spacer/Aero-Holding Chambers (AEROCHAMBER MV) inhaler Use as instructed  . SYNTHROID 88 MCG tablet Take 88 mcg by mouth daily.     Allergies:   Adhesive [tape]; Avelox [moxifloxacin hcl in nacl]; Tussionex pennkinetic er [employer] er]; Codeine; and Penicillins   Social History   Tobacco Use  . Smoking status: Former Smoker    Packs/day: 0.90    Years: 25.00    Pack years: 22.50    Types: Cigarettes    Last attempt to quit: 12/28/1981    Years since quitting: 37.1  . Smokeless tobacco: Never Used  Substance Use Topics  . Alcohol use: Yes    Comment: several  . Drug use: No     Family Hx: The patient's family history includes Cancer in her brother, father, mother, and paternal grandfather; Diabetes in her paternal grandmother; Heart attack in her maternal grandfather; Kidney failure in her paternal grandmother.  ROS:   Please see the history of present illness.    General:no colds or fevers, no weight changes Skin:no rashes or ulcers HEENT:no blurred vision, no congestion CV:see HPI PUL:see HPI GI:no diarrhea constipation or melena, no indigestion GU:no hematuria, no dysuria MS:no joint pain, no claudication Neuro:no syncope, no lightheadedness Endo:no diabetes, + thyroid disease  All other systems reviewed and are negative.   Prior CV studies:   The following studies were reviewed today:  Echo 12/11/18 IMPRESSIONS    1. The left ventricle has normal systolic function with an ejection fraction of 60-65%. The cavity size was normal. Left ventricular diastolic function could not be evaluated secondary to atrial fibrillation.  2. The right ventricle has normal systolic function. The cavity was normal. There is no increase in right ventricular wall thickness.  FINDINGS  Left Ventricle: The left  ventricle has normal systolic function, with an ejection fraction of 60-65%. The cavity size was normal. There is no increase in left ventricular wall thickness. Left ventricular diastolic function could not be evaluated  secondary to atrial fibrillation. Definity contrast agent was given IV to delineate the left ventricular endocardial borders. Right Ventricle: The right ventricle has normal systolic function. The cavity was normal. There is no increase in right ventricular wall thickness. Left Atrium: left atrial size was normal in size Right Atrium: right atrial size was normal in size. Right atrial pressure is estimated at 3 mmHg. Interatrial Septum: No atrial level shunt detected by color flow Doppler. Pericardium: There is no evidence of pericardial effusion. Mitral Valve: The mitral valve is normal in structure. Mitral valve regurgitation is trivial by color flow Doppler. Tricuspid Valve: The tricuspid valve is normal in structure. Tricuspid valve regurgitation is  trivial by color flow Doppler. Aortic Valve: The aortic valve is normal in structure. Aortic valve regurgitation was not assessed by color flow Doppler. Pulmonic Valve: The pulmonic valve was normal in structure. Pulmonic valve regurgitation is trivial by color flow Doppler. Venous: The inferior vena cava is normal in size with greater than 50% respiratory variability.     Labs/Other Tests and Data Reviewed:    EKG:  An ECG dated 12/06/17 was personally reviewed today and demonstrated:  atrial fib rate of 78   Recent Labs: No results found for requested labs within last 8760 hours.   Recent Lipid Panel No results found for: CHOL, TRIG, HDL, CHOLHDL, LDLCALC, LDLDIRECT  Wt Readings from Last 3 Encounters:  02/13/19 165 lb (74.8 kg)  12/06/17 169 lb 4 oz (76.8 kg)  10/11/16 167 lb 1.9 oz (75.8 kg)     Objective:    Vital Signs:  Ht 5\' 4"  (1.626 m)   Wt 165 lb (74.8 kg)   BMI 28.32 kg/m   Unable to get BP  VITAL  SIGNS:  reviewed  General:  Female in NAD Neuro A&O X 3 answerers questions approp Lungs: no SOB with talking Psych pleasant affect   ASSESSMENT & PLAN:    1. A fib permanent - last year found to be in a fib rate controlled and asymptomatic and remains the same.  2. Anticoagulation on Eliquis and no bleeding 3. HLD well controlled 4. Hypothyroidism followed by PCP   COVID-19 Education: The signs and symptoms of COVID-19 were discussed with the patient and how to seek care for testing (follow up with PCP or arrange E-visit).  The importance of social distancing was discussed today.  Time:   Today, I have spent 15 minutes with the patient with telehealth technology discussing the above problems.     Medication Adjustments/Labs and Tests Ordered: Current medicines are reviewed at length with the patient today.  Concerns regarding medicines are outlined above.   Tests Ordered: No orders of the defined types were placed in this encounter.   Medication Changes: No orders of the defined types were placed in this encounter.   Disposition:  Follow up in 6 month(s)  Signed, Cecilie Kicks, NP  02/13/2019 9:59 AM    [place]

## 2019-02-13 ENCOUNTER — Telehealth (INDEPENDENT_AMBULATORY_CARE_PROVIDER_SITE_OTHER): Payer: Medicare Other | Admitting: Cardiology

## 2019-02-13 ENCOUNTER — Other Ambulatory Visit: Payer: Self-pay

## 2019-02-13 ENCOUNTER — Encounter: Payer: Self-pay | Admitting: Cardiology

## 2019-02-13 VITALS — Ht 64.0 in | Wt 165.0 lb

## 2019-02-13 DIAGNOSIS — I4891 Unspecified atrial fibrillation: Secondary | ICD-10-CM

## 2019-02-13 DIAGNOSIS — Z7901 Long term (current) use of anticoagulants: Secondary | ICD-10-CM

## 2019-02-13 DIAGNOSIS — E782 Mixed hyperlipidemia: Secondary | ICD-10-CM

## 2019-02-13 DIAGNOSIS — E034 Atrophy of thyroid (acquired): Secondary | ICD-10-CM

## 2019-02-13 NOTE — Patient Instructions (Signed)
Medication Instructions:  Your physician recommends that you continue on your current medications as directed. Please refer to the Current Medication list given to you today.  If you need a refill on your cardiac medications before your next appointment, please call your pharmacy.   Lab work: None If you have labs (blood work) drawn today and your tests are completely normal, you will receive your results only by: Marland Kitchen MyChart Message (if you have MyChart) OR . A paper copy in the mail If you have any lab test that is abnormal or we need to change your treatment, we will call you to review the results.  Testing/Procedures: None  Follow-Up: At [facility], you and your health needs are our priority.  As part of our continuing mission to provide you with exceptional heart care, we have created designated Provider Care Teams.  These Care Teams include your primary Cardiologist (physician) and Advanced Practice Providers (APPs -  Physician Assistants and Nurse Practitioners) who all work together to provide you with the care you need, when you need it. You will need a follow up appointment in 6 months.  Please call our office 2 months in advance to schedule this appointment.  You may see Jenkins Rouge, MD or one of the following Advanced Practice Providers on your designated Care Team:   Truitt Merle, NP Cecilie Kicks, NP . Kathyrn Drown, NP    2

## 2019-04-03 ENCOUNTER — Other Ambulatory Visit: Payer: Self-pay

## 2019-04-03 ENCOUNTER — Encounter: Payer: Self-pay | Admitting: Family Medicine

## 2019-04-03 ENCOUNTER — Ambulatory Visit (INDEPENDENT_AMBULATORY_CARE_PROVIDER_SITE_OTHER): Payer: Medicare Other | Admitting: Family Medicine

## 2019-04-03 VITALS — BP 128/79 | HR 72 | Temp 97.8°F | Ht 64.0 in | Wt 176.0 lb

## 2019-04-03 DIAGNOSIS — E785 Hyperlipidemia, unspecified: Secondary | ICD-10-CM | POA: Diagnosis not present

## 2019-04-03 DIAGNOSIS — Z87891 Personal history of nicotine dependence: Secondary | ICD-10-CM | POA: Insufficient documentation

## 2019-04-03 DIAGNOSIS — J3089 Other allergic rhinitis: Secondary | ICD-10-CM | POA: Insufficient documentation

## 2019-04-03 DIAGNOSIS — K219 Gastro-esophageal reflux disease without esophagitis: Secondary | ICD-10-CM | POA: Diagnosis not present

## 2019-04-03 DIAGNOSIS — I4891 Unspecified atrial fibrillation: Secondary | ICD-10-CM

## 2019-04-03 DIAGNOSIS — I10 Essential (primary) hypertension: Secondary | ICD-10-CM | POA: Insufficient documentation

## 2019-04-03 DIAGNOSIS — L719 Rosacea, unspecified: Secondary | ICD-10-CM

## 2019-04-03 DIAGNOSIS — E0500 Thyrotoxicosis with diffuse goiter without thyrotoxic crisis or storm: Secondary | ICD-10-CM | POA: Insufficient documentation

## 2019-04-03 DIAGNOSIS — I341 Nonrheumatic mitral (valve) prolapse: Secondary | ICD-10-CM | POA: Insufficient documentation

## 2019-04-03 DIAGNOSIS — Z719 Counseling, unspecified: Secondary | ICD-10-CM

## 2019-04-03 HISTORY — DX: Essential (primary) hypertension: I10

## 2019-04-03 NOTE — Progress Notes (Signed)
New patient office visit note:  Impression and Recommendations:    1. Hypertension, unspecified type   2. Hyperlipidemia, unspecified hyperlipidemia type   3. Atrial fibrillation, new onset ([place])   4. Gastroesophageal reflux disease, esophagitis presence not specified   5. Graves disease   6. Rosacea   7. Health education/counseling     - will obtain set of FBW near future for baseline as pt is new to me.   Prior pcp at [place] and I don't have records.   Hypertension, unspecified type - Plan: Patient asymptomatic.  Blood pressure well controlled.  cardiologist-  sees them for her A. Fib- rather newer onset;  BP & CHOL  Hyperlipidemia, unspecified hyperlipidemia type - Plan:  Will obtain labs   Atrial fibrillation, new onset ([place]) - Plan: MGT per Cards and since new onset, pt will follow with them  Gastroesophageal reflux disease, esophagitis presence not specified - Plan: controlled, cont meds  Graves disease - Plan: obtian labs, meds per Endo doc but told pt we r happy to give them in future  Rosacea - Plan: sees derm  Health education/counseling -  Plan:  - cont 53min or more of daily exercise- great  - Healthy dietary habits encouraged, including low-carb, and high amounts of lean protein in diet.   - Patient should also consume adequate amounts of water - half of body weight in oz of water per day  Education and routine counseling performed. Handouts provided.   Expresses verbal understanding and consents to current therapy plan and treatment regimen.  Return for Chronic 2) OV w me near future & full FBW 2-3d prior.  Please see AVS handed out to patient at the end of our visit for further patient instructions/ counseling done pertaining to today's office visit.    Note:  This document was prepared using Dragon voice recognition software and may include unintentional dictation errors.   ---------------------------------------------------------------------------------------------------------------------------------------------------------------------------------------------    Subjective:    Chief complaint:   Chief Complaint  Patient presents with  . Establish Care     HPI: Tracy [name] is a pleasant 78 y.o. female who presents to [place] at [facility] today to review their medical history with me and establish care.   I asked the patient to review their chronic problem list with me to ensure everything was updated and accurate.    All recent office visits with other providers, any medical records that patient brought in etc  - I reviewed today.     We asked pt to get [place] their medical records from [facility] providers/ specialists that they had seen within the past 3-5 years- if they are in private practice and/or do not work for [employer], [facility], [place], [place] or [employer] owned practice.  Told them to call their specialists to clarify this if they are not sure.   - PCP prior- [employer] social [employer].   It was too far to drive.     Dr Forde Dandy- Endo- private practice.  Pt had severe Graves Dx. Since 1996.   He txs her w Chol meds as well.    Pt loves him and trusts him- wishes he could do it all - NO h/o pre-dm or DM   Dr Johnsie Cancel-  A- fib, BP, on eliquis.    - Seen by Cards team and txed by them for this:   -  PAF. First seen March 2017 had afib post surgery for SBO and  lysis of adhesions. Started on eliquis then Echo 12/15/15 EF 55-60% normal LA size spontaneous conversion and beta blocker dose decreased     [facility]-  Dr Cristina Gong-      Dermatologist:    Sees for [place].   Works with Allyn Kenner, MD.    Does 66min recumbent bike or walking on treadmill daily    Mood/ Stress-- from children.   Daughter has a special needs child and son has agoraphobia and having trouble with no work for 3 years- she worries about  them    Wt Readings from Last 3 Encounters:  04/28/19 173 lb (78.5 kg)  04/03/19 176 lb (79.8 kg)  02/13/19 165 lb (74.8 kg)   BP Readings from Last 3 Encounters:  04/28/19 114/72  04/03/19 128/79  12/06/17 122/82   Pulse Readings from Last 3 Encounters:  04/28/19 86  04/03/19 72  12/06/17 78   BMI Readings from Last 3 Encounters:  04/28/19 29.70 kg/m  04/03/19 30.21 kg/m  02/13/19 28.32 kg/m    Patient Care Team    Relationship Specialty Notifications Start End  Mellody Dance, DO PCP - General Family Medicine  04/03/19   Josue Hector, MD PCP - Cardiology Cardiology Admissions 02/12/19   Ronald Lobo, MD Consulting Physician Gastroenterology  04/03/19   Reynold Bowen, MD Consulting Physician Endocrinology  04/03/19   Levy Sjogren, MD Referring Physician Dermatology  04/03/19   Clista Bernhardt, MD Consulting Physician Oculoplastics Ophthalmology  04/03/19   Allyn Kenner, MD  Dermatology  04/03/19    Comment: sees Carol    Patient Active Problem List   Diagnosis Date Noted  . Hyperlipidemia     Priority: High  . Atrial fibrillation, new onset ([place]) 12/09/2015    Priority: High  . Rosacea     Priority: Medium  . Gastroesophageal reflux disease 04/03/2019    Priority: Low  . Hypertension 04/03/2019  . Environmental and seasonal allergies 04/03/2019  . Laryngopharyngeal reflux (LPR) 04/03/2019  . History of smoking- 20 pk yr,   04/03/2019  . Mitral valve prolapse   . Graves disease   . SBO (small bowel obstruction) ([place]) 12/09/2015  . Small bowel obstruction-surgery 12/09/15 12/05/2015  . Hypothyroidism-(TSH WNL) 12/05/2015  . Generalized abdominal pain 12/05/2015  . Nausea and vomiting 12/05/2015  . Pseudophakia 09/04/2012  . Chronic cough 06/13/2012  . Bronchiectasis ([place]) 06/13/2012    As reported by pt:  Past Medical History:  Diagnosis Date  . Atrial fibrillation [facility]) March 2017   unknown duration. CHADs VASc =3  . Colon polyps   .  Duodenal adenoma   . GERD (gastroesophageal reflux disease)   . Graves disease   . Hyperlipidemia   . Hypertension 04/03/2019  . Hypothyroidism   . LPRD (laryngopharyngeal reflux disease)   . Mitral valve prolapse   . Rosacea      Past Surgical History:  Procedure Laterality Date  . APPENDECTOMY    . CATARACT EXTRACTION, BILATERAL    . COLONOSCOPY     x2  . ESOPHAGOGASTRODUODENOSCOPY     x2  . LAPAROTOMY N/A 12/09/2015   Procedure: EXPLORATORY LAPAROTOMY, LYSIS OF ADHESIONS ;  Surgeon: [name], MD;  Location: [place];  Service: General;  Laterality: N/A;  . PILONIDAL CYST / SINUS EXCISION    . ruptured disk    . TUBAL LIGATION       Family History  Problem Relation Age of Onset  . Cancer Father  lung  . Cancer Mother        lung  . Cancer Paternal Grandfather        stomach  . Diabetes Paternal Grandmother   . Kidney failure Paternal Grandmother   . Heart attack Maternal Grandfather   . Cancer Brother        lung     Social History   Substance and Sexual Activity  Drug Use No     Social History   Substance and Sexual Activity  Alcohol Use Yes   Comment: several     Social History   Tobacco Use  Smoking Status Former Smoker  . Packs/day: 0.90  . Years: 25.00  . Pack years: 22.50  . Types: Cigarettes  . Quit date: 12/28/1981  . Years since quitting: 37.4  Smokeless Tobacco Never Used     Current Meds  Medication Sig  . acetaminophen (TYLENOL) 500 MG tablet Take 1,000 mg by mouth every 6 (six) hours as needed (pain).  . Calcium Carbonate-Vitamin D (CALCIUM-VITAMIN D) 600-200 MG-UNIT CAPS Take 1 tablet by mouth 2 (two) times daily.   . diphenhydramine-acetaminophen (TYLENOL PM) 25-500 MG TABS tablet Take 1 tablet by mouth at bedtime as needed.  . docusate sodium (COLACE) 100 MG capsule Take 1 capsule (100 mg total) by mouth 2 (two) times daily.  Marland Kitchen ezetimibe-simvastatin (VYTORIN) 10-40 MG per tablet Take 1 tablet by mouth at bedtime.    . famotidine (PEPCID) 40 MG tablet Take 40 mg by mouth daily.  . Glucosamine-Chondroit-Vit C-Mn (GLUCOSAMINE 1500 COMPLEX PO) Take 1 tablet by mouth 2 (two) times a day.  . metoprolol tartrate (LOPRESSOR) 25 MG tablet TAKE 1/2 TABLET BY MOUTH 2 TIMES DAILY.  . metroNIDAZOLE (METROGEL) 0.75 % gel Apply 1 application topically 2 (two) times daily.  . montelukast (SINGULAIR) 10 MG tablet Take 10 mg by mouth at bedtime as needed (seasonal allergies).   . Multiple Vitamin (MULTIVITAMIN WITH MINERALS) TABS tablet Take 1 tablet by mouth daily.  Marland Kitchen olopatadine (PATANOL) 0.1 % ophthalmic solution Place 1 drop into both eyes 2 (two) times a day.  Marland Kitchen Spacer/Aero-Holding Chambers (AEROCHAMBER MV) inhaler Use as instructed  . SYNTHROID 88 MCG tablet Take 88 mcg by mouth daily.  . [DISCONTINUED] ELIQUIS 5 MG TABS tablet TAKE 1 TABLET BY MOUTH 2 TIMES DAILY.    Allergies: Adhesive [tape], Avelox [moxifloxacin hcl in nacl], Tussionex pennkinetic er [hydrocod polst-cpm polst er], Codeine, and Penicillins   Review of Systems  Constitutional: Negative for chills, diaphoresis, fever, malaise/fatigue and weight loss.  HENT: Negative for congestion, sore throat and tinnitus.   Eyes: Negative for blurred vision, double vision and photophobia.  Respiratory: Negative for cough and wheezing.   Cardiovascular: Negative for chest pain and palpitations.  Gastrointestinal: Positive for heartburn. Negative for blood in stool, diarrhea, nausea and vomiting.  Genitourinary: Negative for dysuria, frequency and urgency.  Musculoskeletal: Negative for joint pain and myalgias.  Skin: Positive for rash. Negative for itching.  Neurological: Negative for dizziness, focal weakness, weakness and headaches.  Endo/Heme/Allergies: Positive for environmental allergies. Negative for polydipsia. Does not bruise/bleed easily.  Psychiatric/Behavioral: Negative for depression and memory loss. The patient is not nervous/anxious and does not  have insomnia.      Objective:   Blood pressure 128/79, pulse 72, temperature 97.8 F (36.6 C), height 5\' 4"  (3.762 m), weight 176 lb (79.8 kg). Body mass index is 30.21 kg/m. General: Well Developed, well nourished, and in no acute distress.  Neuro: Alert and oriented  x3, extra-ocular muscles intact, sensation grossly intact.  HEENT:[place]/AT, PERRLA, neck supple, No carotid bruits Skin: no gross rashes  Cardiac: Regular rate and rhythm Respiratory: Essentially clear to auscultation bilaterally. Not using accessory muscles, speaking in full sentences.  Abdominal: not grossly distended Musculoskeletal: Ambulates w/o diff, FROM * 4 ext.  Vasc: less 2 sec cap RF, warm and pink  Psych:  No HI/SI, judgement and insight good, Euthymic mood. Full Affect.

## 2019-04-03 NOTE — Patient Instructions (Addendum)
Please realize, EXERCISE IS MEDICINE!  -  American Heart Association ( AHA) guidelines for exercise : If you are in good health, without any medical conditions, you should engage in 150-300 minutes of moderate intensity aerobic activity per week.  This means you should be huffing and puffing throughout your workout.   Engaging in regular exercise will improve brain function and memory, as well as improve mood, boost immune system and help with weight management.  As well as the other, more well-known effects of exercise such as decreasing blood sugar levels, decreasing blood pressure,  and decreasing bad cholesterol levels/ increasing good cholesterol levels.     -  The AHA strongly endorses consumption of a diet that contains a variety of foods from all the food categories with an emphasis on fruits and vegetables; fat-free and low-fat dairy products; cereal and grain products; legumes and nuts; and fish, poultry, and/or extra lean meats.    Excessive food intake, especially of foods high in saturated and trans fats, sugar, and salt, should be avoided.    Adequate water intake of roughly 1/2 of your weight in pounds, should equal the ounces of water per day you should drink.  So for instance, if you're 200 pounds, that would be 100 ounces of water per day.         Mediterranean Diet  Why follow it? Research shows. . Those who follow the Mediterranean diet have a reduced risk of heart disease  . The diet is associated with a reduced incidence of Parkinson's and Alzheimer's diseases . People following the diet may have longer life expectancies and lower rates of chronic diseases  . The Dietary Guidelines for Americans recommends the Mediterranean diet as an eating plan to promote health and prevent disease  What Is the Mediterranean Diet?  . Healthy eating plan based on typical foods and recipes of Mediterranean-style cooking . The diet is primarily a plant based diet; these foods should make up  a majority of meals   Starches - Plant based foods should make up a majority of meals - They are an important sources of vitamins, minerals, energy, antioxidants, and fiber - Choose whole grains, foods high in fiber and minimally processed items  - Typical grain sources include wheat, oats, barley, corn, brown rice, bulgar, farro, millet, polenta, couscous  - Various types of beans include chickpeas, lentils, fava beans, black beans, white beans   Fruits  Veggies - Large quantities of antioxidant rich fruits & veggies; 6 or more servings  - Vegetables can be eaten raw or lightly drizzled with oil and cooked  - Vegetables common to the traditional Mediterranean Diet include: artichokes, arugula, beets, broccoli, brussel sprouts, cabbage, carrots, celery, collard greens, cucumbers, eggplant, kale, leeks, lemons, lettuce, mushrooms, okra, onions, peas, peppers, potatoes, pumpkin, radishes, rutabaga, shallots, spinach, sweet potatoes, turnips, zucchini - Fruits common to the Mediterranean Diet include: apples, apricots, avocados, cherries, clementines, dates, figs, grapefruits, grapes, melons, nectarines, oranges, peaches, pears, pomegranates, strawberries, tangerines  Fats - Replace butter and margarine with healthy oils, such as olive oil, canola oil, and tahini  - Limit nuts to no more than a handful a day  - Nuts include walnuts, almonds, pecans, pistachios, pine nuts  - Limit or avoid candied, honey roasted or heavily salted nuts - Olives are central to the Mediterranean diet - can be eaten whole or used in a variety of dishes   Meats Protein - Limiting red meat: no more than a few times a month -   When eating red meat: choose lean cuts and keep the portion to the size of deck of cards - Eggs: approx. 0 to 4 times a week  - Fish and lean poultry: at least 2 a week  - Healthy protein sources include, chicken, turkey, lean beef, lamb - Increase intake of seafood such as tuna, salmon, trout,  mackerel, shrimp, scallops - Avoid or limit high fat processed meats such as sausage and bacon  Dairy - Include moderate amounts of low fat dairy products  - Focus on healthy dairy such as fat free yogurt, skim milk, low or reduced fat cheese - Limit dairy products higher in fat such as whole or 2% milk, cheese, ice cream  Alcohol - Moderate amounts of red wine is ok  - No more than 5 oz daily for women (all ages) and men older than age 65  - No more than 10 oz of wine daily for men younger than 65  Other - Limit sweets and other desserts  - Use herbs and spices instead of salt to flavor foods  - Herbs and spices common to the traditional Mediterranean Diet include: basil, bay leaves, chives, cloves, cumin, fennel, garlic, lavender, marjoram, mint, oregano, parsley, pepper, rosemary, sage, savory, sumac, tarragon, thyme   It's not just a diet, it's a lifestyle:  . The Mediterranean diet includes lifestyle factors typical of those in the region  . Foods, drinks and meals are best eaten with others and savored . Daily physical activity is important for overall good health . This could be strenuous exercise like running and aerobics . This could also be more leisurely activities such as walking, housework, yard-work, or taking the stairs . Moderation is the key; a balanced and healthy diet accommodates most foods and drinks . Consider portion sizes and frequency of consumption of certain foods   Meal Ideas & Options:  . Breakfast:  o Whole wheat toast or whole wheat English muffins with peanut butter & hard boiled egg o Steel cut oats topped with apples & cinnamon and skim milk  o Fresh fruit: banana, strawberries, melon, berries, peaches  o Smoothies: strawberries, bananas, greek yogurt, peanut butter o Low fat greek yogurt with blueberries and granola  o Egg white omelet with spinach and mushrooms o Breakfast couscous: whole wheat couscous, apricots, skim milk, cranberries  . Sandwiches:   o Hummus and grilled vegetables (peppers, zucchini, squash) on whole wheat bread   o Grilled chicken on whole wheat pita with lettuce, tomatoes, cucumbers or tzatziki  o Tuna salad on whole wheat bread: tuna salad made with greek yogurt, olives, red peppers, capers, green onions o Garlic rosemary lamb pita: lamb sauted with garlic, rosemary, salt & pepper; add lettuce, cucumber, greek yogurt to pita - flavor with lemon juice and black pepper  . Seafood:  o Mediterranean grilled salmon, seasoned with garlic, basil, parsley, lemon juice and black pepper o Shrimp, lemon, and spinach whole-grain pasta salad made with low fat greek yogurt  o Seared scallops with lemon orzo  o Seared tuna steaks seasoned salt, pepper, coriander topped with tomato mixture of olives, tomatoes, olive oil, minced garlic, parsley, green onions and cappers  . Meats:  o Herbed greek chicken salad with kalamata olives, cucumber, feta  o Red bell peppers stuffed with spinach, bulgur, lean ground beef (or lentils) & topped with feta   o Kebabs: skewers of chicken, tomatoes, onions, zucchini, squash  o Turkey burgers: made with red onions, mint, dill, lemon juice, feta   cheese topped with roasted red peppers . Vegetarian o Cucumber salad: cucumbers, artichoke hearts, celery, red onion, feta cheese, tossed in olive oil & lemon juice  o Hummus and whole grain pita points with a greek salad (lettuce, tomato, feta, olives, cucumbers, red onion) o Lentil soup with celery, carrots made with vegetable broth, garlic, salt and pepper  o Tabouli salad: parsley, bulgur, mint, scallions, cucumbers, tomato, radishes, lemon juice, olive oil, salt and pepper.    How to Increase Your Level of Physical Activity  Getting regular physical activity is important for your overall health and well-being. Most people do not get enough exercise. There are easy ways to increase your level of physical activity, even if you have not been very active in  the past or you are just starting out. Why is physical activity important? Physical activity has many short-term and long-term health benefits. Regular exercise can:  Help you lose weight or maintain a healthy weight.  Strengthen your muscles and bones.  Boost your mood and improve self-esteem.  Reduce your risk of certain long-term (chronic) diseases, like heart disease, cancer, and diabetes.  Help you stay capable of walking and moving around (mobile) as you age.  Prevent accidents, such as falls, as you age.  Increase life expectancy.  What are the benefits of being physically active on a regular basis? In addition to improving your physical health, being physically active on most days of the week can help you in ways that you may not expect. Benefits of regular physical activity may include:  Feeling good about your body.  Being able to move around more easily and for longer periods of time without getting tired (increased stamina).  Finding new sources of fun and enjoyment.  Meeting new people who share a common interest.  Being able to fight off illness better (enhanced immunity).  Being able to sleep better.  What can happen if I am not physically active on a regular basis? Not getting enough physical activity can lead to an unhealthy lifestyle and future health problems. This can increase your chances of:  Becoming overweight or obese.  Becoming sick.  Developing chronic illnesses, like heart disease or diabetes.  Having mental health problems, like depression or anxiety.  Having sleep problems.  Having trouble walking or getting yourself around (reduced mobility).  Injuring yourself in a fall as you get older.  What steps can I take to be more physically active?  Check with your health care provider about how to get started. Ask your health care provider what activities are safe for you.  Start out slowly. Walking or doing some simple chair exercises is  a good place to start, especially if you have not been active before or for a long time.  Try to find activities that you enjoy. You are more likely to commit to an exercise routine if it does not feel like a chore.  If you have bone or joint problems, choose low-impact exercises, like walking or swimming.  Include physical activity in your everyday routine.  Invite friends or family members to exercise with you. This also will help you commit to your workout plan.  Set goals that you can work toward.  Aim for at least 150 minutes of moderate-intensity exercise each week. Examples of moderate-intensity exercise include walking or riding a bike. Where to find more information:  Centers for Disease Control and Prevention: www.cdc.gov/physicalactivity/index.html  [employer]'s Council on Fitness, Sports & Nutrition www.fitness.gov/resource-center  ChooseMyPlate: www.choosemyplate.gov/physical-activity Contact a health   care provider if:  You have headaches, muscle aches, or joint pain.  You feel dizzy or light-headed while exercising.  You faint.  You have chest pain while exercising. Summary  Exercise benefits your mind and body at any age, even if you are just starting out.  If you have a chronic illness or have not been active for a while, check with your health care provider before increasing your physical activity.  Choose activities that are safe and enjoyable for you.Ask your health care provider what activities are safe for you.  Start slowly. Tell your health care provider if you have problems as you start to increase your activity level. This information is not intended to replace advice given to you by your health care provider. Make sure you discuss any questions you have with your health care provider. Document Released: 09/07/2016 Document Revised: 09/07/2016 Document Reviewed: 09/07/2016 [employer] Interactive Patient Education  2018 [employer] 

## 2019-04-08 ENCOUNTER — Other Ambulatory Visit: Payer: Self-pay | Admitting: Cardiovascular Disease

## 2019-04-08 NOTE — Telephone Encounter (Signed)
Pt is a 78yof requesting eliquis 5mg , scr 0.8(09/16/18), wt 79.8kg, lov w/ingold 02/13/19

## 2019-04-21 ENCOUNTER — Other Ambulatory Visit: Payer: Self-pay

## 2019-04-21 DIAGNOSIS — E034 Atrophy of thyroid (acquired): Secondary | ICD-10-CM

## 2019-04-21 DIAGNOSIS — E0500 Thyrotoxicosis with diffuse goiter without thyrotoxic crisis or storm: Secondary | ICD-10-CM

## 2019-04-21 DIAGNOSIS — R1084 Generalized abdominal pain: Secondary | ICD-10-CM

## 2019-04-21 DIAGNOSIS — I4891 Unspecified atrial fibrillation: Secondary | ICD-10-CM

## 2019-04-21 DIAGNOSIS — I10 Essential (primary) hypertension: Secondary | ICD-10-CM

## 2019-04-21 DIAGNOSIS — E785 Hyperlipidemia, unspecified: Secondary | ICD-10-CM

## 2019-04-24 ENCOUNTER — Other Ambulatory Visit: Payer: Self-pay

## 2019-04-24 ENCOUNTER — Other Ambulatory Visit: Payer: Medicare Other

## 2019-04-24 DIAGNOSIS — E0500 Thyrotoxicosis with diffuse goiter without thyrotoxic crisis or storm: Secondary | ICD-10-CM

## 2019-04-24 DIAGNOSIS — E034 Atrophy of thyroid (acquired): Secondary | ICD-10-CM

## 2019-04-24 DIAGNOSIS — I10 Essential (primary) hypertension: Secondary | ICD-10-CM

## 2019-04-24 DIAGNOSIS — R1084 Generalized abdominal pain: Secondary | ICD-10-CM

## 2019-04-24 DIAGNOSIS — E785 Hyperlipidemia, unspecified: Secondary | ICD-10-CM

## 2019-04-24 DIAGNOSIS — I4891 Unspecified atrial fibrillation: Secondary | ICD-10-CM

## 2019-04-25 LAB — VITAMIN B12: Vitamin B-12: 483 pg/mL (ref 232–1245)

## 2019-04-25 LAB — CBC WITH DIFFERENTIAL/PLATELET
Basophils Absolute: 0.1 10*3/uL (ref 0.0–0.2)
Basos: 1 %
EOS (ABSOLUTE): 0.2 10*3/uL (ref 0.0–0.4)
Eos: 2 %
Hematocrit: 43.3 % (ref 34.0–46.6)
Hemoglobin: 14.4 g/dL (ref 11.1–15.9)
Immature Grans (Abs): 0 10*3/uL (ref 0.0–0.1)
Immature Granulocytes: 0 %
Lymphocytes Absolute: 2.3 10*3/uL (ref 0.7–3.1)
Lymphs: 30 %
MCH: 33.3 pg — ABNORMAL HIGH (ref 26.6–33.0)
MCHC: 33.3 g/dL (ref 31.5–35.7)
MCV: 100 fL — ABNORMAL HIGH (ref 79–97)
Monocytes Absolute: 0.6 10*3/uL (ref 0.1–0.9)
Monocytes: 7 %
Neutrophils Absolute: 4.7 10*3/uL (ref 1.4–7.0)
Neutrophils: 60 %
Platelets: 247 10*3/uL (ref 150–450)
RBC: 4.33 x10E6/uL (ref 3.77–5.28)
RDW: 12.1 % (ref 11.7–15.4)
WBC: 7.8 10*3/uL (ref 3.4–10.8)

## 2019-04-25 LAB — COMPREHENSIVE METABOLIC PANEL
ALT: 11 IU/L (ref 0–32)
AST: 22 IU/L (ref 0–40)
Albumin/Globulin Ratio: 2.4 — ABNORMAL HIGH (ref 1.2–2.2)
Albumin: 4.5 g/dL (ref 3.7–4.7)
Alkaline Phosphatase: 53 IU/L (ref 39–117)
BUN/Creatinine Ratio: 19 (ref 12–28)
BUN: 16 mg/dL (ref 8–27)
Bilirubin Total: 0.6 mg/dL (ref 0.0–1.2)
CO2: 24 mmol/L (ref 20–29)
Calcium: 9 mg/dL (ref 8.7–10.3)
Chloride: 101 mmol/L (ref 96–106)
Creatinine, Ser: 0.86 mg/dL (ref 0.57–1.00)
GFR calc Af Amer: 75 mL/min/{1.73_m2} (ref >59)
GFR calc non Af Amer: 65 mL/min/{1.73_m2} (ref >59)
Globulin, Total: 1.9 g/dL (ref 1.5–4.5)
Glucose: 96 mg/dL (ref 65–99)
Potassium: 4.4 mmol/L (ref 3.5–5.2)
Sodium: 140 mmol/L (ref 134–144)
Total Protein: 6.4 g/dL (ref 6.0–8.5)

## 2019-04-25 LAB — LIPID PANEL
Chol/HDL Ratio: 2.4 ratio (ref 0.0–4.4)
Cholesterol, Total: 172 mg/dL (ref 100–199)
HDL: 71 mg/dL (ref >39)
LDL Calculated: 79 mg/dL (ref 0–99)
Triglycerides: 112 mg/dL (ref 0–149)
VLDL Cholesterol Cal: 22 mg/dL (ref 5–40)

## 2019-04-25 LAB — VITAMIN D 25 HYDROXY (VIT D DEFICIENCY, FRACTURES): Vit D, 25-Hydroxy: 37.7 ng/mL (ref 30.0–100.0)

## 2019-04-25 LAB — HEMOGLOBIN A1C
Est. average glucose Bld gHb Est-mCnc: 111 mg/dL
Hgb A1c MFr Bld: 5.5 % (ref 4.8–5.6)

## 2019-04-25 LAB — T4, FREE: Free T4: 1.8 ng/dL — ABNORMAL HIGH (ref 0.82–1.77)

## 2019-04-25 LAB — TSH: TSH: 1.27 u[IU]/mL (ref 0.450–4.500)

## 2019-04-28 ENCOUNTER — Other Ambulatory Visit: Payer: Self-pay

## 2019-04-28 ENCOUNTER — Encounter: Payer: Self-pay | Admitting: Family Medicine

## 2019-04-28 ENCOUNTER — Ambulatory Visit: Payer: Medicare Other | Admitting: Family Medicine

## 2019-04-28 VITALS — BP 114/72 | HR 86 | Wt 173.0 lb

## 2019-04-28 DIAGNOSIS — I10 Essential (primary) hypertension: Secondary | ICD-10-CM

## 2019-04-28 DIAGNOSIS — Z87891 Personal history of nicotine dependence: Secondary | ICD-10-CM

## 2019-04-28 DIAGNOSIS — E034 Atrophy of thyroid (acquired): Secondary | ICD-10-CM | POA: Diagnosis not present

## 2019-04-28 DIAGNOSIS — I4891 Unspecified atrial fibrillation: Secondary | ICD-10-CM | POA: Diagnosis not present

## 2019-04-28 DIAGNOSIS — E785 Hyperlipidemia, unspecified: Secondary | ICD-10-CM | POA: Diagnosis not present

## 2019-04-28 NOTE — Progress Notes (Signed)
Assessment and plan:  1. Atrial fibrillation, new onset ([place])   2. Hypertension, unspecified type   3. Hypothyroidism due to acquired atrophy of thyroid   4. Hyperlipidemia, unspecified hyperlipidemia type   5. History of smoking- 20 pk yr,      1. Care Plan in Time of COVID - Given patient's age, advised patient to avoid doctor's offices in the future if at all possible. - Discussed that if patient experiences emergency health concerns, she should be assessed in person.  - Discussed policies and practices here at the clinic, and answered all questions about care team and health management during [place].  - Discussed prudent health practices during [place].  2. Arthritis & Glucosamine Chondroitin Supplementation Advised patient that if she is taking any supplement, we should know about them all and she needs to ensure med list accurate - Discussed that it may exacerbate the effects of her blood thinners or any other medication she might take.  - Reviewed that side-effects may occur with  - If patient believes that the glucosamine chondroitin helps her joint pain, she may continue to take it.  If it is not providing noticeable benefits, discussed that she should not take it.  - Encouraged patient to exercise regularly to help alleviate joint pain. - Advised patient to use ice to help alleviate joint pain PRN.  Reviewed that this helps with inflammation and swelling. - Advised patient to avoid use of heat for joint pain.  3. Reviewed recent lab work (04/24/2019) in depth with patient today.  All lab work within normal limits unless otherwise noted.  Will continue to monitor.  4. Hypothyroidism - Successfully managed on synthroid. - Patient asymptomatic at this time.   Med Mgt per Endo Dr Forde Dandy.  - Advised patient to continue taking thyroid medication separately from other medications.  - Discussed that patient's T4 is  slightly elevated at 1.80 and these results will be forwarded to endocrinology.  - Continue thyroid management and follow-up with endocrinology as recommended. - Will continue to monitor.  5. Vitamin D - Per patient managed on Calcium & Vitamin D supplementation - Advised patient to double up current Calcium/Vitamin D supplementation.  6. Hyperlipidemia & Cholesterol Management - HDL = 72, discussed that this value is fantastic. - LDL = 79. - Managed well on treatment plan.  Continue as prescribed - Advised patient to continue to take her cholesterol medications at night.  - Patient follows up with Dr. Johnsie Cancel of cardiology. - Advised patient to continue to follow up with cardiology as recommended.  7. Urination - Discussed that if patient experiences concerns regarding her urinary habits, she may return PRN for follow-up.  8. HTN - Continue treatment plan as recommended. - Will continue to monitor.  9. Recommendations - Return in 4-6 months for follow-up and Medicare Wellness.  - Discussed need for patient to continue to obtain management and screenings with all established specialists.  Educated patient at length about the critical importance of keeping health maintenance up to date.  - Participated in lengthy conversation and all questions were answered.   Education and routine counseling performed. Handouts provided.    Return for 67mo f/up for medicare wellness- over the phone.   Anticipatory guidance and routine counseling done re: condition, txmnt options and need for follow up. All questions of patient's were answered.   Gross side effects, risk and benefits, and alternatives of medications discussed with patient.  Patient is aware that all  medications have potential side effects and we are unable to predict every sideeffect or drug-drug interaction that may occur.  Expresses verbal understanding and consents to current therapy plan and treatment regiment.  Please  see AVS handed out to patient at the end of our visit for additional patient instructions/ counseling done pertaining to today's office visit.  Note:  This document was prepared using Dragon voice recognition software and may include unintentional dictation errors.  This document serves as a record of services personally performed by Mellody Dance, DO. It was created on her behalf by Toni Amend, a trained medical scribe. The creation of this record is based on the scribe's personal observations and the provider's statements to them.   I have reviewed the above medical documentation for accuracy and completeness and I concur.  Mellody Dance, DO 05/02/2019 4:07 PM   -------------------------------------------------------------------------------------------------------------------    Subjective:   CC:   Tracy [name] is a 78 y.o. female who presents to [place] at [facility] today for review and discussion of recent bloodwork that was done in addition to f/up on chronic conditions we are managing for pt.  1. All recent blood work that we ordered was reviewed with patient today.  Patient was counseled on all abnormalities and we discussed dietary and lifestyle changes that could help those values (also medications when appropriate).  Extensive health counseling performed and all patient's concerns/ questions were addressed.  See labs below and also plan for more details of these abnormalities  Notes she tries to take her medications correctly.  She takes her thyroid medication around 3 AM.  When discussing kidney health, humorously states "oh I can pee."  Denies new swelling in her legs.  Arthritis & Glucosamine Chondroitin Supplementation Patient notes she's been taking glucosamine chondroitin supplement and is concerned about it interacting with blood thinners.  Per patient, notes that it is not helping with her joint pain much.  Notes she exercises on the  recumbent bike.   Wt Readings from Last 3 Encounters:  04/28/19 173 lb (78.5 kg)  04/03/19 176 lb (79.8 kg)  02/13/19 165 lb (74.8 kg)   BP Readings from Last 3 Encounters:  04/28/19 114/72  04/03/19 128/79  12/06/17 122/82   Pulse Readings from Last 3 Encounters:  04/28/19 86  04/03/19 72  12/06/17 78   BMI Readings from Last 3 Encounters:  04/28/19 29.70 kg/m  04/03/19 30.21 kg/m  02/13/19 28.32 kg/m     Patient Care Team    Relationship Specialty Notifications Start End  Mellody Dance, DO PCP - General Family Medicine  04/03/19   Josue Hector, MD PCP - Cardiology Cardiology Admissions 02/12/19   Ronald Lobo, MD Consulting Physician Gastroenterology  04/03/19   Reynold Bowen, MD Consulting Physician Endocrinology  04/03/19   Levy Sjogren, MD Referring Physician Dermatology  04/03/19   Clista Bernhardt, MD Consulting Physician [facility]  04/03/19   Allyn Kenner, MD  Dermatology  04/03/19    Comment: sees Arbie Cookey    Full medical history updated and reviewed in the office today  Patient Active Problem List   Diagnosis Date Noted  . Hyperlipidemia     Priority: High  . Atrial fibrillation, new onset ([place]) 12/09/2015    Priority: High  . Gastroesophageal reflux disease 04/03/2019    Priority: Low  . Hypertension 04/03/2019  . Environmental and seasonal allergies 04/03/2019  . Laryngopharyngeal reflux (LPR) 04/03/2019  . History of smoking- 20 pk yr,  04/03/2019  . Mitral valve prolapse   . Graves disease   . SBO (small bowel obstruction) ([place]) 12/09/2015  . Small bowel obstruction-surgery 12/09/15 12/05/2015  . Hypothyroidism-(TSH WNL) 12/05/2015  . Generalized abdominal pain 12/05/2015  . Nausea and vomiting 12/05/2015  . Pseudophakia 09/04/2012  . Chronic cough 06/13/2012  . Bronchiectasis ([place]) 06/13/2012    Past Medical History:  Diagnosis Date  . Atrial fibrillation [facility]) March 2017   unknown duration. CHADs VASc =3  .  Colon polyps   . Duodenal adenoma   . GERD (gastroesophageal reflux disease)   . Graves disease   . Hyperlipidemia   . Hypertension 04/03/2019  . Hypothyroidism   . LPRD (laryngopharyngeal reflux disease)   . Mitral valve prolapse   . Rosacea     Past Surgical History:  Procedure Laterality Date  . APPENDECTOMY    . CATARACT EXTRACTION, BILATERAL    . COLONOSCOPY     x2  . ESOPHAGOGASTRODUODENOSCOPY     x2  . LAPAROTOMY N/A 12/09/2015   Procedure: EXPLORATORY LAPAROTOMY, LYSIS OF ADHESIONS ;  Surgeon: [name], MD;  Location: [place];  Service: General;  Laterality: N/A;  . PILONIDAL CYST / SINUS EXCISION    . ruptured disk    . TUBAL LIGATION      Social History   Tobacco Use  . Smoking status: Former Smoker    Packs/day: 0.90    Years: 25.00    Pack years: 22.50    Types: Cigarettes    Quit date: 12/28/1981    Years since quitting: 37.3  . Smokeless tobacco: Never Used  Substance Use Topics  . Alcohol use: Yes    Comment: several    Family Hx: Family History  Problem Relation Age of Onset  . Cancer Father        lung  . Cancer Mother        lung  . Cancer Paternal Grandfather        stomach  . Diabetes Paternal Grandmother   . Kidney failure Paternal Grandmother   . Heart attack Maternal Grandfather   . Cancer Brother        lung     Medications: Current Outpatient Medications  Medication Sig Dispense Refill  . acetaminophen (TYLENOL) 500 MG tablet Take 1,000 mg by mouth every 6 (six) hours as needed (pain).    . Calcium Carbonate-Vitamin D (CALCIUM-VITAMIN D) 600-200 MG-UNIT CAPS Take 1 tablet by mouth 2 (two) times daily.     . diphenhydramine-acetaminophen (TYLENOL PM) 25-500 MG TABS tablet Take 1 tablet by mouth at bedtime as needed.    . docusate sodium (COLACE) 100 MG capsule Take 1 capsule (100 mg total) by mouth 2 (two) times daily. 10 capsule 0  . ELIQUIS 5 MG TABS tablet TAKE 1 TABLET BY MOUTH 2 TIMES DAILY. 180 tablet 1  .  ezetimibe-simvastatin (VYTORIN) 10-40 MG per tablet Take 1 tablet by mouth at bedtime.     . famotidine (PEPCID) 40 MG tablet Take 40 mg by mouth daily.    . Glucosamine-Chondroit-Vit C-Mn (GLUCOSAMINE 1500 COMPLEX PO) Take 1 tablet by mouth 2 (two) times a day.    . metoprolol tartrate (LOPRESSOR) 25 MG tablet TAKE 1/2 TABLET BY MOUTH 2 TIMES DAILY. 90 tablet 1  . metroNIDAZOLE (METROGEL) 0.75 % gel Apply 1 application topically 2 (two) times daily.    . montelukast (SINGULAIR) 10 MG tablet Take 10 mg by mouth at bedtime as needed (seasonal allergies).   8  .  Multiple Vitamin (MULTIVITAMIN WITH MINERALS) TABS tablet Take 1 tablet by mouth daily.    Marland Kitchen olopatadine (PATANOL) 0.1 % ophthalmic solution Place 1 drop into both eyes 2 (two) times a day.    Marland Kitchen Spacer/Aero-Holding Chambers (AEROCHAMBER MV) inhaler Use as instructed 1 each 0  . SYNTHROID 88 MCG tablet Take 88 mcg by mouth daily.     No current facility-administered medications for this visit.     Allergies:  Allergies  Allergen Reactions  . Adhesive [Tape]     Surgical tape  . Avelox [Moxifloxacin Hcl In Nacl] Other (See Comments)    N/V/rash/swelling  . Tussionex Pennkinetic Er [Hydrocod Polst-Cpm Polst Er] Itching and Nausea And Vomiting  . Codeine Itching, Nausea And Vomiting and Rash    ALL FORMS OF CODEINE  . Penicillins Itching, Nausea And Vomiting, Swelling and Rash    ALL FORMS OF PENICILLINS Has patient had a PCN reaction causing immediate rash, facial/tongue/throat swelling, SOB or lightheadedness with hypotension: Yes Has patient had a PCN reaction causing severe rash involving mucus membranes or skin necrosis: No Has patient had a PCN reaction that required hospitalization No Has patient had a PCN reaction occurring within the last 10 years: No If all of the above answers are "NO", then may proceed with Cephalosporin use.     Review of Systems: General:   No F/C, wt loss Pulm:   No DIB, SOB, pleuritic chest pain  Card:  No CP, palpitations Abd:  No n/v/d or pain Ext:  No inc edema from baseline  Objective:  Blood pressure 114/72, pulse 86, weight 173 lb (78.5 kg), SpO2 100 %. Body mass index is 29.7 kg/m. Gen:   Well NAD, A and O *3 HEENT:    [place]/AT, EOMI,  MMM Lungs:   Normal work of breathing. CTA B/L, no Wh, rhonchi Heart:   Irregularly irregular, S1, S2 WNL's, no MRG Abd:   No gross distention Exts:    warm, pink,  Brisk capillary refill, warm and well perfused.  Psych:    No HI/SI, judgement and insight good, Euthymic mood. Full Affect.   Recent Results (from the past 2160 hour(s))  Vitamin B12     Status: None   Collection Time: 04/24/19  9:22 AM  Result Value Ref Range   Vitamin B-12 483 232 - 1,245 pg/mL  TSH     Status: None   Collection Time: 04/24/19  9:22 AM  Result Value Ref Range   TSH 1.270 0.450 - 4.500 uIU/mL  VITAMIN D 25 Hydroxy (Vit-D Deficiency, Fractures)     Status: None   Collection Time: 04/24/19  9:22 AM  Result Value Ref Range   Vit D, 25-Hydroxy 37.7 30.0 - 100.0 ng/mL    Comment: Vitamin D deficiency has been defined by the [place] and an Endocrine Society practice guideline as a level of serum 25-OH vitamin D less than 20 ng/mL (1,2). The Endocrine Society went on to further define vitamin D insufficiency as a level between 21 and 29 ng/mL (2). 1. IOM (Institute of Medicine). 2010. Dietary reference    intakes for calcium and D. [place]: The    [employer]. 2. Holick MF, Binkley Esto, Bischoff-Ferrari HA, et al.    Evaluation, treatment, and prevention of vitamin D    deficiency: an Endocrine Society clinical practice    guideline. JCEM. 2011 Jul; 96(7):1911-30.   T4, free     Status: Abnormal   Collection Time: 04/24/19  9:22 AM  Result Value Ref Range   Free T4 1.80 (H) 0.82 - 1.77 ng/dL  Lipid panel     Status: None   Collection Time: 04/24/19  9:22 AM  Result Value Ref Range   Cholesterol, Total 172 100 - 199  mg/dL   Triglycerides 112 0 - 149 mg/dL   HDL 71 >39 mg/dL   VLDL Cholesterol Cal 22 5 - 40 mg/dL   LDL Calculated 79 0 - 99 mg/dL   Chol/HDL Ratio 2.4 0.0 - 4.4 ratio    Comment:                                   T. Chol/HDL Ratio                                             Men  Women                               1/2 Avg.Risk  3.4    3.3                                   Avg.Risk  5.0    4.4                                2X Avg.Risk  9.6    7.1                                3X Avg.Risk 23.4   11.0   Hemoglobin A1c     Status: None   Collection Time: 04/24/19  9:22 AM  Result Value Ref Range   Hgb A1c MFr Bld 5.5 4.8 - 5.6 %    Comment:          Prediabetes: 5.7 - 6.4          Diabetes: >6.4          Glycemic control for adults with diabetes: <7.0    Est. average glucose Bld gHb Est-mCnc 111 mg/dL  Comprehensive metabolic panel     Status: Abnormal   Collection Time: 04/24/19  9:22 AM  Result Value Ref Range   Glucose 96 65 - 99 mg/dL   BUN 16 8 - 27 mg/dL   Creatinine, Ser 0.86 0.57 - 1.00 mg/dL   GFR calc non Af Amer 65 >59 mL/min/1.73   GFR calc Af Amer 75 >59 mL/min/1.73   BUN/Creatinine Ratio 19 12 - 28   Sodium 140 134 - 144 mmol/L   Potassium 4.4 3.5 - 5.2 mmol/L   Chloride 101 96 - 106 mmol/L   CO2 24 20 - 29 mmol/L   Calcium 9.0 8.7 - 10.3 mg/dL   Total Protein 6.4 6.0 - 8.5 g/dL   Albumin 4.5 3.7 - 4.7 g/dL   Globulin, Total 1.9 1.5 - 4.5 g/dL   Albumin/Globulin Ratio 2.4 (H) 1.2 - 2.2   Bilirubin Total 0.6 0.0 - 1.2 mg/dL   Alkaline Phosphatase 53 39 - 117 IU/L   AST 22 0 - 40 IU/L   ALT 11  0 - 32 IU/L  CBC with Differential/Platelet     Status: Abnormal   Collection Time: 04/24/19  9:22 AM  Result Value Ref Range   WBC 7.8 3.4 - 10.8 x10E3/uL   RBC 4.33 3.77 - 5.28 x10E6/uL   Hemoglobin 14.4 11.1 - 15.9 g/dL   Hematocrit 43.3 34.0 - 46.6 %   MCV 100 (H) 79 - 97 fL   MCH 33.3 (H) 26.6 - 33.0 pg   MCHC 33.3 31.5 - 35.7 g/dL   RDW 12.1 11.7 - 15.4 %    Platelets 247 150 - 450 x10E3/uL   Neutrophils 60 Not Estab. %   Lymphs 30 Not Estab. %   Monocytes 7 Not Estab. %   Eos 2 Not Estab. %   Basos 1 Not Estab. %   Neutrophils Absolute 4.7 1.4 - 7.0 x10E3/uL   Lymphocytes Absolute 2.3 0.7 - 3.1 x10E3/uL   Monocytes Absolute 0.6 0.1 - 0.9 x10E3/uL   EOS (ABSOLUTE) 0.2 0.0 - 0.4 x10E3/uL   Basophils Absolute 0.1 0.0 - 0.2 x10E3/uL   Immature Granulocytes 0 Not Estab. %   Immature Grans (Abs) 0.0 0.0 - 0.1 x10E3/uL

## 2019-05-18 DIAGNOSIS — L719 Rosacea, unspecified: Secondary | ICD-10-CM | POA: Insufficient documentation

## 2019-06-27 ENCOUNTER — Telehealth: Payer: Self-pay | Admitting: Family Medicine

## 2019-06-27 NOTE — Telephone Encounter (Signed)
Patient called states she has a cough that whistles back afterward & is requesting provider call her w/ advice.@ [phone].  --Forwarding message to medical assistant.  --Dion Body

## 2019-06-30 NOTE — Telephone Encounter (Signed)
Pt denies loss of smell, diarrhea, nausea, vomiting, and fever.  Pt states that she only has the cough and SOB, but states that it is no worse than usual.  Advised pt that she will need to go for COVID testing.  Pt provided with testing site information.  Pt expressed understanding and is agreeable.  Charyl Bigger, CMA

## 2019-07-01 ENCOUNTER — Other Ambulatory Visit: Payer: Self-pay

## 2019-07-01 DIAGNOSIS — Z20822 Contact with and (suspected) exposure to covid-19: Secondary | ICD-10-CM

## 2019-07-02 LAB — NOVEL CORONAVIRUS, NAA: SARS-CoV-2, NAA: NOT DETECTED

## 2019-07-03 ENCOUNTER — Ambulatory Visit: Payer: Medicare Other | Admitting: Adult Health

## 2019-07-03 ENCOUNTER — Encounter: Payer: Self-pay | Admitting: Adult Health

## 2019-07-03 ENCOUNTER — Other Ambulatory Visit: Payer: Self-pay

## 2019-07-03 DIAGNOSIS — R05 Cough: Secondary | ICD-10-CM | POA: Diagnosis not present

## 2019-07-03 DIAGNOSIS — R059 Cough, unspecified: Secondary | ICD-10-CM | POA: Insufficient documentation

## 2019-07-03 MED ORDER — AZITHROMYCIN 250 MG PO TABS: ORAL_TABLET | ORAL | 0 refills | Status: DC

## 2019-07-03 NOTE — Patient Instructions (Signed)
Cough, Adult Coughing is a reflex that clears your throat and your airways (respiratory system). Coughing helps to heal and protect your lungs. It is normal to cough occasionally, but a cough that happens with other symptoms or lasts a long time may be a sign of a condition that needs treatment. An acute cough may only last 2-3 weeks, while a chronic cough may last 8 or more weeks. Coughing is commonly caused by:  Infection of the respiratory systemby viruses or bacteria.  Breathing in substances that irritate your lungs.  Allergies.  Asthma.  Mucus that runs down the back of your throat (postnasal drip).  Smoking.  Acid backing up from the stomach into the esophagus (gastroesophageal reflux).  Certain medicines.  Chronic lung problems.  Other medical conditions such as heart failure or a blood clot in the lung (pulmonary embolism). Follow these instructions at home: Medicines  Take over-the-counter and prescription medicines only as told by your health care provider.  Talk with your health care provider before you take a cough suppressant medicine. Lifestyle   Avoid cigarette smoke. Do not use any products that contain nicotine or tobacco, such as cigarettes, e-cigarettes, and chewing tobacco. If you need help quitting, ask your health care provider.  Drink enough fluid to keep your urine pale yellow.  Avoid caffeine.  Do not drink alcohol if your health care provider tells you not to drink. General instructions   Pay close attention to changes in your cough. Tell your health care provider about them.  Always cover your mouth when you cough.  Avoid things that make you cough, such as perfume, candles, cleaning products, or campfire or tobacco smoke.  If the air is dry, use a cool mist vaporizer or humidifier in your bedroom or your home to help loosen secretions.  If your cough is worse at night, try to sleep in a semi-upright position.  Rest as needed.  Keep  all follow-up visits as told by your health care provider. This is important. Contact a health care provider if you:  Have new symptoms.  Cough up pus.  Have a cough that does not get better after 2-3 weeks or gets worse.  Cannot control your cough with cough suppressant medicines and you are losing sleep.  Have pain that gets worse or pain that is not helped with medicine.  Have a fever.  Have unexplained weight loss.  Have night sweats. Get help right away if:  You cough up blood.  You have difficulty breathing.  Your heartbeat is very fast. These symptoms may represent a serious problem that is an emergency. Do not wait to see if the symptoms will go away. Get medical help right away. Call your local emergency services (911 in the [place] Do not drive yourself to the hospital. Summary  Coughing is a reflex that clears your throat and your airways. It is normal to cough occasionally, but a cough that happens with other symptoms or lasts a long time may be a sign of a condition that needs treatment.  Take over-the-counter and prescription medicines only as told by your health care provider.  Always cover your mouth when you cough.  Contact a health care provider if you have new symptoms or a cough that does not get better after 2-3 weeks or gets worse. This information is not intended to replace advice given to you by your health care provider. Make sure you discuss any questions you have with your health care provider. Document Released: 03/17/2011   Document Revised: 10/07/2018 Document Reviewed: 10/07/2018 Elsevier Patient Education  [place].  Increase fluids, rest, Vit C. Please take Azithromycin as directed. Recommend either OTC Robitussin or Delsym for symptom control. Please continue to social distance and wear a mask when in public. If symptoms persist after antibiotic completed, please follow-up with Dr. Raliegh Scarlet. FEEL BETTER!

## 2019-07-03 NOTE — Assessment & Plan Note (Signed)
Increase fluids, rest, Vit C. Please take Azithromycin as directed. Recommend either OTC Robitussin or Delsym for symptom control. Please continue to social distance and wear a mask when in public. If symptoms persist after antibiotic completed, please follow-up with Dr. Raliegh Scarlet.

## 2019-07-03 NOTE — Progress Notes (Signed)
Subjective:    Patient ID: Tracy [name], female    DOB: 14-Apr-1941, 77 y.o.   MRN: [id number]  HPI:  [name] presents with productive cough >2 weeks- clear/thick mucus and clear nasal drainage.   She denies HA/sore throat/fever/night sweats. She denies otalgia/facial pain. She denies loss of sense of smell or taste. She has not used any OTC remedies. She denies tobacco/vape use. She has not been on any ABX in last 90 days.  07/01/2019  SARS-CoV-2, NAA Not Detected     Patient Care Team    Relationship Specialty Notifications Start End  Mellody Dance, DO PCP - General Family Medicine  04/03/19   Josue Hector, MD PCP - Cardiology Cardiology Admissions 02/12/19   Ronald Lobo, MD Consulting Physician Gastroenterology  04/03/19   Reynold Bowen, MD Consulting Physician Endocrinology  04/03/19   Levy Sjogren, MD Referring Physician Dermatology  04/03/19   Clista Bernhardt, MD Consulting Physician Oculoplastics Ophthalmology  04/03/19   Allyn Kenner, MD  Dermatology  04/03/19    Comment: sees Carol    Patient Active Problem List   Diagnosis Date Noted  . Cough in adult 07/03/2019  . Rosacea   . Gastroesophageal reflux disease 04/03/2019  . Hypertension 04/03/2019  . Environmental and seasonal allergies 04/03/2019  . Laryngopharyngeal reflux (LPR) 04/03/2019  . History of smoking- 20 pk yr,   04/03/2019  . Mitral valve prolapse   . Hyperlipidemia   . Graves disease   . Atrial fibrillation, new onset ([place]) 12/09/2015  . SBO (small bowel obstruction) ([place]) 12/09/2015  . Small bowel obstruction-surgery 12/09/15 12/05/2015  . Hypothyroidism-(TSH WNL) 12/05/2015  . Generalized abdominal pain 12/05/2015  . Nausea and vomiting 12/05/2015  . Pseudophakia 09/04/2012  . Chronic cough 06/13/2012  . Bronchiectasis ([place]) 06/13/2012     Past Medical History:  Diagnosis Date  . Atrial fibrillation [facility]) March 2017   unknown duration. CHADs VASc =3  . Colon polyps   .  Duodenal adenoma   . GERD (gastroesophageal reflux disease)   . Graves disease   . Hyperlipidemia   . Hypertension 04/03/2019  . Hypothyroidism   . LPRD (laryngopharyngeal reflux disease)   . Mitral valve prolapse   . Rosacea      Past Surgical History:  Procedure Laterality Date  . APPENDECTOMY    . CATARACT EXTRACTION, BILATERAL    . COLONOSCOPY     x2  . ESOPHAGOGASTRODUODENOSCOPY     x2  . LAPAROTOMY N/A 12/09/2015   Procedure: EXPLORATORY LAPAROTOMY, LYSIS OF ADHESIONS ;  Surgeon: [name], MD;  Location: [place];  Service: General;  Laterality: N/A;  . PILONIDAL CYST / SINUS EXCISION    . ruptured disk    . TUBAL LIGATION       Family History  Problem Relation Age of Onset  . Cancer Father        lung  . Cancer Mother        lung  . Cancer Paternal Grandfather        stomach  . Diabetes Paternal Grandmother   . Kidney failure Paternal Grandmother   . Heart attack Maternal Grandfather   . Cancer Brother        lung     Social History   Substance and Sexual Activity  Drug Use No     Social History   Substance and Sexual Activity  Alcohol Use Yes   Comment: several     Social History   Tobacco  Use  Smoking Status Former Smoker  . Packs/day: 0.90  . Years: 25.00  . Pack years: 22.50  . Types: Cigarettes  . Quit date: 12/28/1981  . Years since quitting: 37.5  Smokeless Tobacco Never Used     Outpatient Encounter Medications as of 07/03/2019  Medication Sig  . acetaminophen (TYLENOL) 500 MG tablet Take 1,000 mg by mouth every 6 (six) hours as needed (pain).  . Calcium Carbonate-Vitamin D (CALCIUM-VITAMIN D) 600-200 MG-UNIT CAPS Take 1 tablet by mouth 2 (two) times daily.   . diphenhydramine-acetaminophen (TYLENOL PM) 25-500 MG TABS tablet Take 1 tablet by mouth at bedtime as needed.  . docusate sodium (COLACE) 100 MG capsule Take 1 capsule (100 mg total) by mouth 2 (two) times daily.  Marland Kitchen ELIQUIS 5 MG TABS tablet TAKE 1 TABLET BY MOUTH 2  TIMES DAILY.  Marland Kitchen ezetimibe-simvastatin (VYTORIN) 10-40 MG per tablet Take 1 tablet by mouth at bedtime.   . famotidine (PEPCID) 40 MG tablet Take 40 mg by mouth daily.  . Glucosamine-Chondroit-Vit C-Mn (GLUCOSAMINE 1500 COMPLEX PO) Take 1 tablet by mouth 2 (two) times a day.  . metoprolol tartrate (LOPRESSOR) 25 MG tablet TAKE 1/2 TABLET BY MOUTH 2 TIMES DAILY.  . metroNIDAZOLE (METROGEL) 0.75 % gel Apply 1 application topically 2 (two) times daily.  . montelukast (SINGULAIR) 10 MG tablet Take 10 mg by mouth at bedtime as needed (seasonal allergies).   . Multiple Vitamin (MULTIVITAMIN WITH MINERALS) TABS tablet Take 1 tablet by mouth daily.  Marland Kitchen olopatadine (PATANOL) 0.1 % ophthalmic solution Place 1 drop into both eyes 2 (two) times a day.  Marland Kitchen Spacer/Aero-Holding Chambers (AEROCHAMBER MV) inhaler Use as instructed  . SYNTHROID 88 MCG tablet Take 88 mcg by mouth daily.  Marland Kitchen azithromycin (ZITHROMAX) 250 MG tablet 2 tabs days one, one tab days two-five.   No facility-administered encounter medications on file as of 07/03/2019.     Allergies: Adhesive [tape], Avelox [moxifloxacin hcl in nacl], Tussionex pennkinetic er [hydrocod polst-cpm polst er], Codeine, and Penicillins  Body mass index is 29.76 kg/m.  Blood pressure 103/65, pulse 64, temperature 98.3 F (36.8 C), temperature source Oral, height 5\' 4"  (1.626 m), weight 173 lb 6.4 oz (78.7 kg), SpO2 96 %.  Review of Systems  Constitutional: Positive for fatigue. Negative for activity change, appetite change, chills, diaphoresis and unexpected weight change.  HENT: Positive for congestion, postnasal drip, rhinorrhea and sinus pressure. Negative for dental problem, drooling, ear discharge, ear pain, facial swelling, hearing loss, mouth sores, nosebleeds, sinus pain, sneezing, sore throat, trouble swallowing and voice change.   Respiratory: Positive for cough. Negative for chest tightness, shortness of breath, wheezing and stridor.    Cardiovascular: Negative for chest pain, palpitations and leg swelling.  Gastrointestinal: Negative for abdominal distention, abdominal pain, blood in stool, constipation, diarrhea, nausea and vomiting.  Endocrine: Negative for cold intolerance, heat intolerance, polydipsia, polyphagia and polyuria.  Neurological: Negative for dizziness and headaches.  Hematological: Negative for adenopathy. Does not bruise/bleed easily.       Objective:   Physical Exam Vitals signs and nursing note reviewed.  Constitutional:      General: She is not in acute distress.    Appearance: Normal appearance. She is normal weight. She is not ill-appearing, toxic-appearing or diaphoretic.  HENT:     Head: Normocephalic and atraumatic.     Right Ear: Tympanic membrane, ear canal and external ear normal. No decreased hearing noted. There is no impacted cerumen. Tympanic membrane is not bulging.  Left Ear: Tympanic membrane, ear canal and external ear normal. Decreased hearing noted. There is no impacted cerumen. Tympanic membrane is not bulging.     Nose: Mucosal edema, congestion and rhinorrhea present. No nasal tenderness.     Right Turbinates: Swollen.     Left Turbinates: Swollen.     Right Sinus: No maxillary sinus tenderness or frontal sinus tenderness.     Left Sinus: No maxillary sinus tenderness or frontal sinus tenderness.     Mouth/Throat:     Pharynx: Posterior oropharyngeal erythema present. No oropharyngeal exudate.     Tonsils: 0 on the right. 0 on the left.  Eyes:     Extraocular Movements: Extraocular movements intact.     Conjunctiva/sclera: Conjunctivae normal.     Pupils: Pupils are equal, round, and reactive to light.  Cardiovascular:     Rate and Rhythm: Normal rate and regular rhythm.     Pulses: Normal pulses.     Heart sounds: Normal heart sounds. No murmur. No friction rub. No gallop.   Pulmonary:     Effort: Pulmonary effort is normal. No respiratory distress.     Breath  sounds: Normal breath sounds. No stridor. No wheezing, rhonchi or rales.  Chest:     Chest wall: No tenderness.  Skin:    General: Skin is warm and dry.     Capillary Refill: Capillary refill takes less than 2 seconds.  Neurological:     Mental Status: She is alert and oriented to person, place, and time.  Psychiatric:        Mood and Affect: Mood normal.        Behavior: Behavior normal.        Thought Content: Thought content normal.        Judgment: Judgment normal.        Assessment & Plan:   1. Cough in adult     Cough in adult Increase fluids, rest, Vit C. Please take Azithromycin as directed. Recommend either OTC Robitussin or Delsym for symptom control. Please continue to social distance and wear a mask when in public. If symptoms persist after antibiotic completed, please follow-up with Dr. Raliegh Scarlet.    FOLLOW-UP:  Return if symptoms worsen or fail to improve.

## 2019-07-17 ENCOUNTER — Other Ambulatory Visit: Payer: Self-pay | Admitting: Cardiology

## 2019-08-12 ENCOUNTER — Ambulatory Visit (INDEPENDENT_AMBULATORY_CARE_PROVIDER_SITE_OTHER): Payer: Medicare Other | Admitting: Family Medicine

## 2019-08-12 ENCOUNTER — Other Ambulatory Visit: Payer: Self-pay

## 2019-08-12 ENCOUNTER — Encounter: Payer: Self-pay | Admitting: Family Medicine

## 2019-08-12 VITALS — BP 131/66 | HR 58 | Ht 65.0 in | Wt 170.0 lb

## 2019-08-12 DIAGNOSIS — Z0000 Encounter for general adult medical examination without abnormal findings: Secondary | ICD-10-CM | POA: Diagnosis not present

## 2019-08-12 DIAGNOSIS — J3089 Other allergic rhinitis: Secondary | ICD-10-CM | POA: Diagnosis not present

## 2019-08-12 DIAGNOSIS — R05 Cough: Secondary | ICD-10-CM | POA: Diagnosis not present

## 2019-08-12 DIAGNOSIS — Z719 Counseling, unspecified: Secondary | ICD-10-CM

## 2019-08-12 DIAGNOSIS — Z5329 Procedure and treatment not carried out because of patient's decision for other reasons: Secondary | ICD-10-CM | POA: Insufficient documentation

## 2019-08-12 DIAGNOSIS — Z91199 Patient's noncompliance with other medical treatment and regimen due to unspecified reason: Secondary | ICD-10-CM

## 2019-08-12 MED ORDER — FEXOFENADINE HCL 180 MG PO TABS: 180.0000 mg | ORAL_TABLET | Freq: Every day | ORAL | 3 refills | Status: DC

## 2019-08-12 NOTE — Progress Notes (Signed)
Subjective:   Tracy [name] is a 78 y.o. female who presents for Medicare Annual (Subsequent) preventive examination.  HPI: Notes on the phone today that she's still coughing. Had a negative COVID test on 07/01/2019. States she continues taking allergy medicines including Singulair, which was given to her a long time ago. Patient states she knows her last bone density was normal, but doesn't remember when it was obtained. Per patient, regarding why she has stopped obtaining/remembering her historical screenings, "I was told after a certain age I would no longer need a mammogram." States she is in possession of records of her last colonoscopy with Dr. Cristina Gong, has records of shingles vaccination, records of pneumonia vaccination, and records of recent influenza vaccination. Lives by herself at home. Does not have pets. Denies concerns with her ability to life normal life. Denies vision difficulties; "just a little bit of" hard of hearing. "I'm not to the point that I'm ready for a hearing aid, let's put it that way. States her last visit to an eye doctor was last year. "I have been going to Dr. Kristeen Miss () of [place]. Says she had to have a little bit of eye surgery on the lid that "caused me to have a black eye," but "can't remember the name of the little critter." Says she's "due now" and "I was kind of waiting on things that were going on in our country to get settled down." Says "I love to read and I can't do without these eyes." Typically obtains yearly dermatological screenings, "but not this year." Notes she talks to her children on a daily basis; they check on her every day "and we have good conversations." She also talks to friends daily. Notes she walks often, but not outside anymore "because of people that want to burn their leaves and things like that and I can't breathe." Uses a treadmill at home. States her activity level depends on how she's feeling; "I will walk around the house, go up and use  the treadmill, use the recumbent bike." She has a gym up there that was formerly her husband's. "I can't use everything and don't try, but I enjoy walking and I can walk and read and pedal and read, so it works for me."     Objective:     Vitals: Ht 5\' 5"  (1.651 m)   Wt 170 lb (77.1 kg)   BMI 28.29 kg/m   Body mass index is 28.29 kg/m.  Advanced Directives 12/04/2015  Does Patient Have a Medical Advance Directive? Yes  Type of Paramedic of [place];Living will;Out of facility DNR (pink MOST or yellow form)    Tobacco Social History   Tobacco Use  Smoking Status Former Smoker  . Packs/day: 0.90  . Years: 25.00  . Pack years: 22.50  . Types: Cigarettes  . Quit date: 12/28/1981  . Years since quitting: 37.6  Smokeless Tobacco Never Used     Counseling given: Not Answered   Past Medical History:  Diagnosis Date  . Atrial fibrillation [facility]) March 2017   unknown duration. CHADs VASc =3  . Colon polyps   . Duodenal adenoma   . GERD (gastroesophageal reflux disease)   . Graves disease   . Hyperlipidemia   . Hypertension 04/03/2019  . Hypothyroidism   . LPRD (laryngopharyngeal reflux disease)   . Mitral valve prolapse   . Rosacea    Past Surgical History:  Procedure Laterality Date  . APPENDECTOMY    .  CATARACT EXTRACTION, BILATERAL    . COLONOSCOPY     x2  . ESOPHAGOGASTRODUODENOSCOPY     x2  . LAPAROTOMY N/A 12/09/2015   Procedure: EXPLORATORY LAPAROTOMY, LYSIS OF ADHESIONS ;  Surgeon: [name], MD;  Location: [place];  Service: General;  Laterality: N/A;  . PILONIDAL CYST / SINUS EXCISION    . ruptured disk    . TUBAL LIGATION     Family History  Problem Relation Age of Onset  . Cancer Father        lung  . Cancer Mother        lung  . Cancer Paternal Grandfather        stomach  . Diabetes Paternal Grandmother   . Kidney failure Paternal Grandmother   . Heart attack Maternal Grandfather   . Cancer Brother        lung   Social  History   Socioeconomic History  . Marital status: Widowed    Spouse name: Not on file  . Number of children: 3  . Years of education: Not on file  . Highest education level: Not on file  Occupational History  . Occupation: retired    Comment: Network engineer  Social Needs  . Financial resource strain: Not on file  . Food insecurity    Worry: Not on file    Inability: Not on file  . Transportation needs    Medical: Not on file    Non-medical: Not on file  Tobacco Use  . Smoking status: Former Smoker    Packs/day: 0.90    Years: 25.00    Pack years: 22.50    Types: Cigarettes    Quit date: 12/28/1981    Years since quitting: 37.6  . Smokeless tobacco: Never Used  Substance and Sexual Activity  . Alcohol use: Yes    Comment: several  . Drug use: No  . Sexual activity: Not Currently  Lifestyle  . Physical activity    Days per week: Not on file    Minutes per session: Not on file  . Stress: Not on file  Relationships  . Social Herbalist on phone: Not on file    Gets together: Not on file    Attends religious service: Not on file    Active member of club or organization: Not on file    Attends meetings of clubs or organizations: Not on file    Relationship status: Not on file  Other Topics Concern  . Not on file  Social History Narrative  . Not on file    Outpatient Encounter Medications as of 08/12/2019  Medication Sig  . acetaminophen (TYLENOL) 500 MG tablet Take 1,000 mg by mouth every 6 (six) hours as needed (pain).  . Calcium Carbonate-Vitamin D (CALCIUM-VITAMIN D) 600-200 MG-UNIT CAPS Take 1 tablet by mouth 2 (two) times daily.   Marland Kitchen docusate sodium (COLACE) 100 MG capsule Take 1 capsule (100 mg total) by mouth 2 (two) times daily.  Marland Kitchen ELIQUIS 5 MG TABS tablet TAKE 1 TABLET BY MOUTH 2 TIMES DAILY.  Marland Kitchen ezetimibe-simvastatin (VYTORIN) 10-40 MG per tablet Take 1 tablet by mouth at bedtime.   . famotidine (PEPCID) 40 MG tablet Take 40 mg by mouth daily.  .  Glucosamine-Chondroit-Vit C-Mn (GLUCOSAMINE 1500 COMPLEX PO) Take 1 tablet by mouth 2 (two) times a day.  . metoprolol tartrate (LOPRESSOR) 25 MG tablet TAKE 1/2 TABLET BY MOUTH 2 TIMES DAILY.  . metroNIDAZOLE (METROGEL) 0.75 % gel Apply 1 application  topically 2 (two) times daily.  . montelukast (SINGULAIR) 10 MG tablet Take 10 mg by mouth at bedtime as needed (seasonal allergies).   . Multiple Vitamin (MULTIVITAMIN WITH MINERALS) TABS tablet Take 1 tablet by mouth daily.  Marland Kitchen olopatadine (PATANOL) 0.1 % ophthalmic solution Place 1 drop into both eyes 2 (two) times a day.  Marland Kitchen SYNTHROID 88 MCG tablet Take 88 mcg by mouth daily.  Marland Kitchen Spacer/Aero-Holding Chambers (AEROCHAMBER MV) inhaler Use as instructed  . [DISCONTINUED] azithromycin (ZITHROMAX) 250 MG tablet 2 tabs days one, one tab days two-five. (Patient not taking: Reported on 08/12/2019)  . [DISCONTINUED] diphenhydramine-acetaminophen (TYLENOL PM) 25-500 MG TABS tablet Take 1 tablet by mouth at bedtime as needed.   No facility-administered encounter medications on file as of 08/12/2019.     Activities of Daily Living In your present state of health, do you have any difficulty performing the following activities: 08/12/2019 04/03/2019  Hearing? Y N  Vision? N N  Difficulty concentrating or making decisions? N N  Walking or climbing stairs? Y N  Dressing or bathing? N N  Doing errands, shopping? N N  Some recent data might be hidden    Patient Care Team: Mellody Dance, DO as PCP - General (Family Medicine) Josue Hector, MD as PCP - Cardiology (Cardiology) Ronald Lobo, MD as Consulting Physician (Gastroenterology) Reynold Bowen, MD as Consulting Physician (Endocrinology) Levy Sjogren, MD as Referring Physician (Dermatology) Clista Bernhardt, MD as Consulting Physician [facility] Ophthalmology) Allyn Kenner, MD (Dermatology)    Assessment:   This is a routine wellness examination for Tracy [name].  Exercise  Activities and Dietary recommendations    Goals   None     Fall Risk Fall Risk  08/12/2019 04/03/2019  Falls in the past year? 0 1  Number falls in past yr: 0 0  Injury with Fall? 0 0  Risk for fall due to : - Impaired mobility  Follow up - Falls evaluation completed   Is the patient's home free of loose throw rugs in walkways, pet beds, electrical cords, etc?   yes      Grab bars in the bathroom? yes      Handrails on the stairs?   yes      Adequate lighting?   yes  Timed Get Up and Go performed:   Depression Screen PHQ 2/9 Scores 08/12/2019 07/03/2019 04/28/2019 04/03/2019  PHQ - 2 Score 0 0 0 0  PHQ- 9 Score 2 7 5 7      Cognitive Function     6CIT Screen 08/12/2019  What Year? 0 points  What month? 0 points  What time? 0 points  Count back from 20 0 points  Months in reverse 0 points  Repeat phrase 0 points  Total Score 0    Immunization History  Administered Date(s) Administered  . Influenza Split 07/03/2011, 07/02/2012  . Influenza, High Dose Seasonal PF 07/10/2018  . Pneumococcal Polysaccharide-23 12/31/2005  . Zoster 10/02/2006  . Zoster Recombinat (Shingrix) 01/03/2018    Qualifies for Shingles Vaccine? UTD Screening Tests Health Maintenance  Topic Date Due  . DEXA SCAN  04/02/2020 (Originally 12/27/2005)  . TETANUS/TDAP  04/02/2020 (Originally 12/28/1959)  . PNA vac Low Risk Adult (2 of 2 - PCV13) 04/02/2020 (Originally 01/01/2007)  . INFLUENZA VACCINE  Completed    Cancer Screenings: Lung: Low Dose CT Chest recommended if Age 56-80 years, 30 pack-year currently smoking OR have quit w/in 15years. Patient does not qualify. Breast:  Up to date on Mammogram?  No  -  Up to date of Bone Density/Dexa? No Colorectal: UTD  Additional Screenings:  Hepatitis C Screening:  HIV Screening:     Plan:    PLAN:  - Extensive education provided today regarding need for screenings/immunizations.  - Lengthy conversation held and all questions answered. - Advised  patient to call her insurance company and ask when/where her last mammogram, pap smear, colonoscopy, etc. was obtained.  - Patient knows to call clinic and speak with CMA to update her screening/immunization records. - Per pt, last DEXA was normal. Need for records.  - Per patient, has obtained shingles vaccine and has records. Need for updated information. - Need to update information regarding pneumonia vaccine.  - Need for up-to-date information on influenza vaccination.  ---> Explained to patient the importance that we need to get these records of her past immunizations, screening test etc.  Patient needs to let us know where she had these as she is new to [place] and we need this information in order to track down the studies and results.  - Advised ongoing prudent health habits and activity habits at home. - Encouraged patient to engage in regular physical activity daily, especially a formal exercise routine.   - F/up 4-6 months for monitoring chronic conditions, sooner if issues or concerns.    For her chronic cough postnasal drip due to seasonal allergies: - Patient will continue her Singulair.  Per patient this is given to her by Dr. Forde Dandy - For her allergies, advised using a daily OTC treatment such as Allegra, Zyrtec, Xyzal during seasons of flare. - If her allergy symptoms exacerbate, patient knows she may return to the clinic sooner for follow-up.    I have personally reviewed and noted the following in the patient's chart:   . Medical and social history . Use of alcohol, tobacco or illicit drugs  . Current medications and supplements . Functional ability and status . Nutritional status . Physical activity . Advanced directives . List of other physicians . Hospitalizations, surgeries, and ER visits in previous 12 months . Vitals . Screenings to include cognitive, depression, and falls . Referrals and appointments  In addition, I have reviewed and discussed with patient certain  preventive protocols, quality metrics, and best practice recommendations. A written personalized care plan for preventive services as well as general preventive health recommendations were provided to patient.     [place], CMA  08/12/2019

## 2019-08-13 NOTE — Progress Notes (Signed)
Date:  08/19/2019   ID:  Tracy [name], DOB December 23, 1940, MRN [id number]  Provider Location: Office  PCP:  Mellody Dance, DO  Cardiologist:  Jenkins Rouge, MD  Electrophysiologist:  None   Evaluation Performed:  Follow-Up Visit  Chief Complaint:  Atrial fib   History of Present Illness:    Tracy [name] is a 78 y.o. female with PAF. First seen March 2017 had afib post surgery for SBO and lysis of adhesions. Started on eliquis then Echo 12/15/15 EF 55-60% normal LA size spontaneous conversion and beta blocker dose decreased   Stress from children. Daughter has a special needs child and son Has agoraphobia and having trouble with no work for 3 years  Back In afib in office 12/06/17  Asymptomatic Compliant with meds  so she has remained in a fib.  Rate controlled and anticoagulation.    No cardiac symptoms specifically no palpitaitons   Has missed some of her afternoon eliquis doses Discussed risk of stroke with missed doses   The patient does not have symptoms concerning for COVID-19 infection (fever, chills, cough, or new shortness of breath).    Past Medical History:  Diagnosis Date  . Atrial fibrillation [facility]) March 2017   unknown duration. CHADs VASc =3  . Colon polyps   . Duodenal adenoma   . GERD (gastroesophageal reflux disease)   . Graves disease   . Hyperlipidemia   . Hypertension 04/03/2019  . Hypothyroidism   . LPRD (laryngopharyngeal reflux disease)   . Mitral valve prolapse   . Rosacea    Past Surgical History:  Procedure Laterality Date  . APPENDECTOMY    . CATARACT EXTRACTION, BILATERAL    . COLONOSCOPY     x2  . ESOPHAGOGASTRODUODENOSCOPY     x2  . LAPAROTOMY N/A 12/09/2015   Procedure: EXPLORATORY LAPAROTOMY, LYSIS OF ADHESIONS ;  Surgeon: [name], MD;  Location: [place];  Service: General;  Laterality: N/A;  . PILONIDAL CYST / SINUS EXCISION    . ruptured disk    . TUBAL LIGATION       Current Meds  Medication Sig  . acetaminophen  (TYLENOL) 500 MG tablet Take 1,000 mg by mouth every 6 (six) hours as needed (pain).  . Calcium Carbonate-Vitamin D (CALCIUM-VITAMIN D) 600-200 MG-UNIT CAPS Take 1 tablet by mouth 2 (two) times daily.   Marland Kitchen docusate sodium (COLACE) 100 MG capsule Take 1 capsule (100 mg total) by mouth 2 (two) times daily.  Marland Kitchen ELIQUIS 5 MG TABS tablet TAKE 1 TABLET BY MOUTH 2 TIMES DAILY.  Marland Kitchen ezetimibe-simvastatin (VYTORIN) 10-40 MG per tablet Take 1 tablet by mouth at bedtime.   . famotidine (PEPCID) 40 MG tablet Take 40 mg by mouth daily.  . fexofenadine (ALLEGRA) 180 MG tablet Take 1 tablet (180 mg total) by mouth daily.  . Glucosamine-Chondroit-Vit C-Mn (GLUCOSAMINE 1500 COMPLEX PO) Take 1 tablet by mouth 2 (two) times a day.  . metoprolol tartrate (LOPRESSOR) 25 MG tablet TAKE 1/2 TABLET BY MOUTH 2 TIMES DAILY.  . metroNIDAZOLE (METROGEL) 0.75 % gel Apply 1 application topically 2 (two) times daily.  . montelukast (SINGULAIR) 10 MG tablet Take 10 mg by mouth at bedtime as needed (seasonal allergies).   . Multiple Vitamin (MULTIVITAMIN WITH MINERALS) TABS tablet Take 1 tablet by mouth daily.  Marland Kitchen olopatadine (PATANOL) 0.1 % ophthalmic solution Place 1 drop into both eyes 2 (two) times a day.  Marland Kitchen Spacer/Aero-Holding Chambers (AEROCHAMBER MV) inhaler Use as instructed  .  SYNTHROID 88 MCG tablet Take 88 mcg by mouth daily.     Allergies:   Adhesive [tape], Avelox [moxifloxacin hcl in nacl], Tussionex pennkinetic er [hydrocod polst-cpm polst er], Codeine, and Penicillins   Social History   Tobacco Use  . Smoking status: Former Smoker    Packs/day: 0.90    Years: 25.00    Pack years: 22.50    Types: Cigarettes    Quit date: 12/28/1981    Years since quitting: 37.6  . Smokeless tobacco: Never Used  Substance Use Topics  . Alcohol use: Yes    Comment: several  . Drug use: No     Family Hx: The patient's family history includes Cancer in her brother, father, mother, and paternal grandfather; Diabetes in her  paternal grandmother; Heart attack in her maternal grandfather; Kidney failure in her paternal grandmother.  ROS:   Please see the history of present illness.    General:no colds or fevers, no weight changes Skin:no rashes or ulcers HEENT:no blurred vision, no congestion CV:see HPI PUL:see HPI GI:no diarrhea constipation or melena, no indigestion GU:no hematuria, no dysuria MS:no joint pain, no claudication Neuro:no syncope, no lightheadedness Endo:no diabetes, + thyroid disease  All other systems reviewed and are negative.   Prior CV studies:   The following studies were reviewed today:  Echo 12/11/18 IMPRESSIONS    1. The left ventricle has normal systolic function with an ejection fraction of 60-65%. The cavity size was normal. Left ventricular diastolic function could not be evaluated secondary to atrial fibrillation.  2. The right ventricle has normal systolic function. The cavity was normal. There is no increase in right ventricular wall thickness.  FINDINGS  Left Ventricle: The left ventricle has normal systolic function, with an ejection fraction of 60-65%. The cavity size was normal. There is no increase in left ventricular wall thickness. Left ventricular diastolic function could not be evaluated  secondary to atrial fibrillation. Definity contrast agent was given IV to delineate the left ventricular endocardial borders. Right Ventricle: The right ventricle has normal systolic function. The cavity was normal. There is no increase in right ventricular wall thickness. Left Atrium: left atrial size was normal in size Right Atrium: right atrial size was normal in size. Right atrial pressure is estimated at 3 mmHg. Interatrial Septum: No atrial level shunt detected by color flow Doppler. Pericardium: There is no evidence of pericardial effusion. Mitral Valve: The mitral valve is normal in structure. Mitral valve regurgitation is trivial by color flow Doppler. Tricuspid  Valve: The tricuspid valve is normal in structure. Tricuspid valve regurgitation is trivial by color flow Doppler. Aortic Valve: The aortic valve is normal in structure. Aortic valve regurgitation was not assessed by color flow Doppler. Pulmonic Valve: The pulmonic valve was normal in structure. Pulmonic valve regurgitation is trivial by color flow Doppler. Venous: The inferior vena cava is normal in size with greater than 50% respiratory variability.     Labs/Other Tests and Data Reviewed:    EKG:  08/19/19 afib rate 59  Recent Labs: 04/24/2019: ALT 11; BUN 16; Creatinine, Ser 0.86; Hemoglobin 14.4; Platelets 247; Potassium 4.4; Sodium 140; TSH 1.270   Recent Lipid Panel Lab Results  Component Value Date/Time   CHOL 172 04/24/2019 09:22 AM   TRIG 112 04/24/2019 09:22 AM   HDL 71 04/24/2019 09:22 AM   CHOLHDL 2.4 04/24/2019 09:22 AM   LDLCALC 79 04/24/2019 09:22 AM    Wt Readings from Last 3 Encounters:  08/19/19 170 lb (77.1 kg)  08/12/19 170 lb (77.1 kg)  07/03/19 173 lb 6.4 oz (78.7 kg)     Objective:    Vital Signs:  BP (!) 110/58   Pulse (!) 59   Ht 5\' 5"  (1.651 m)   Wt 170 lb (77.1 kg)   SpO2 99%   BMI 28.29 kg/m    Affect appropriate Healthy:  appears stated age HEENT: normal Neck supple with no adenopathy JVP normal no bruits no thyromegaly Lungs clear with no wheezing and good diaphragmatic motion Heart:  S1/S2 no murmur, no rub, gallop or click PMI normal Abdomen: benighn, BS positve, no tenderness, no AAA no bruit.  No HSM or HJR Distal pulses intact with no bruits No edema Neuro non-focal Skin warm and dry No muscular weakness   ASSESSMENT & PLAN:    1. A fib permanent - last year found to be in a fib rate controlled and asymptomatic and remains the same.  2. Anticoagulation on Eliquis and no bleeding 3. HLD well controlled 4. Hypothyroidism TSH normal 04/24/19   COVID-19 Education: The signs and symptoms of COVID-19 were discussed with the  patient and how to seek care for testing (follow up with PCP or arrange E-visit).  The importance of social distancing was discussed today.  Time:   Today, I have spent 30 minutes with patient    Medication Adjustments/Labs and Tests Ordered: Current medicines are reviewed at length with the patient today.  Concerns regarding medicines are outlined above.   Tests Ordered: No orders of the defined types were placed in this encounter.   Medication Changes: No orders of the defined types were placed in this encounter.   Disposition:  Follow up in a year   Signed, Jenkins Rouge, MD  08/19/2019 10:02 AM    [place]

## 2019-08-19 ENCOUNTER — Ambulatory Visit: Payer: Medicare Other | Admitting: Cardiovascular Disease

## 2019-08-19 ENCOUNTER — Encounter: Payer: Self-pay | Admitting: Cardiovascular Disease

## 2019-08-19 ENCOUNTER — Other Ambulatory Visit: Payer: Self-pay

## 2019-08-19 VITALS — BP 110/58 | HR 59 | Ht 65.0 in | Wt 170.0 lb

## 2019-08-19 DIAGNOSIS — I482 Chronic atrial fibrillation, unspecified: Secondary | ICD-10-CM | POA: Diagnosis not present

## 2019-08-19 NOTE — Patient Instructions (Signed)
Medication Instructions:  Your physician recommends that you continue on your current medications as directed. Please refer to the Current Medication list given to you today.  *If you need a refill on your cardiac medications before your next appointment, please call your pharmacy*  Lab Work: NONE If you have labs (blood work) drawn today and your tests are completely normal, you will receive your results only by: Marland Kitchen MyChart Message (if you have MyChart) OR . A paper copy in the mail If you have any lab test that is abnormal or we need to change your treatment, we will call you to review the results.  Testing/Procedures: NONE  Follow-Up: At [facility], you and your health needs are our priority.  As part of our continuing mission to provide you with exceptional heart care, we have created designated Provider Care Teams.  These Care Teams include your primary Cardiologist (physician) and Advanced Practice Providers (APPs -  Physician Assistants and Nurse Practitioners) who all work together to provide you with the care you need, when you need it.  Your next appointment:   6 months  The format for your next appointment:   In Person  Provider:   Jenkins Rouge, MD  Other Instructions

## 2019-09-08 DIAGNOSIS — E039 Hypothyroidism, unspecified: Secondary | ICD-10-CM | POA: Diagnosis not present

## 2019-09-08 DIAGNOSIS — I480 Paroxysmal atrial fibrillation: Secondary | ICD-10-CM | POA: Diagnosis not present

## 2019-09-08 DIAGNOSIS — I2510 Atherosclerotic heart disease of native coronary artery without angina pectoris: Secondary | ICD-10-CM | POA: Diagnosis not present

## 2019-09-08 DIAGNOSIS — E0500 Thyrotoxicosis with diffuse goiter without thyrotoxic crisis or storm: Secondary | ICD-10-CM | POA: Diagnosis not present

## 2019-09-08 DIAGNOSIS — E785 Hyperlipidemia, unspecified: Secondary | ICD-10-CM | POA: Diagnosis not present

## 2019-10-07 ENCOUNTER — Other Ambulatory Visit: Payer: Self-pay | Admitting: Cardiovascular Disease

## 2019-10-07 NOTE — Telephone Encounter (Signed)
Prescription refill request for Eliquis received.  Last office visit: Nishan, 08/19/2019 Scr: 0.86, 04/24/2019 Age: 79 y.o. Weight: 77.1 kg  Prescription refill sent.

## 2020-01-15 ENCOUNTER — Other Ambulatory Visit: Payer: Self-pay | Admitting: Cardiology

## 2020-03-16 NOTE — Progress Notes (Signed)
Date:  03/22/2020   ID:  Tracy [name], DOB 1940/10/29, MRN [id number]  Provider Location: Office  PCP:  Lorrene Reid, PA-C  Cardiologist:  Jenkins Rouge, MD  Electrophysiologist:  None   Evaluation Performed:  Follow-Up Visit  Chief Complaint:  Atrial fib   History of Present Illness:    Tracy [name] is a 80 y.o. female with PAF. First seen March 2017 had afib post surgery for SBO and lysis of adhesions. Started on eliquis then Echo 12/15/15 EF 55-60% normal LA size spontaneous conversion and beta blocker dose decreased   Stress from children. Daughter has a special needs child and son Has agoraphobia and having trouble with no work for 3 years  Back In afib in office 12/06/17  Asymptomatic Compliant with meds  so she has remained in a fib.  Rate controlled and anticoagulation.    No cardiac symptoms specifically no palpitaitons   No bleeding issues on eliquis  Has had COVID vaccine    Past Medical History:  Diagnosis Date  . Atrial fibrillation [facility]) March 2017   unknown duration. CHADs VASc =3  . Colon polyps   . Duodenal adenoma   . GERD (gastroesophageal reflux disease)   . Graves disease   . Hyperlipidemia   . Hypertension 04/03/2019  . Hypothyroidism   . LPRD (laryngopharyngeal reflux disease)   . Mitral valve prolapse   . Rosacea    Past Surgical History:  Procedure Laterality Date  . APPENDECTOMY    . CATARACT EXTRACTION, BILATERAL    . COLONOSCOPY     x2  . ESOPHAGOGASTRODUODENOSCOPY     x2  . LAPAROTOMY N/A 12/09/2015   Procedure: EXPLORATORY LAPAROTOMY, LYSIS OF ADHESIONS ;  Surgeon: [name], MD;  Location: [place];  Service: General;  Laterality: N/A;  . PILONIDAL CYST / SINUS EXCISION    . ruptured disk    . TUBAL LIGATION       Current Meds  Medication Sig  . acetaminophen (TYLENOL) 500 MG tablet Take 1,000 mg by mouth every 6 (six) hours as needed (pain).  . Calcium Carbonate-Vitamin D (CALCIUM-VITAMIN D) 600-200 MG-UNIT CAPS Take  1 tablet by mouth 2 (two) times daily.   Marland Kitchen docusate sodium (COLACE) 100 MG capsule Take 1 capsule (100 mg total) by mouth 2 (two) times daily.  Marland Kitchen ELIQUIS 5 MG TABS tablet TAKE 1 TABLET BY MOUTH 2 TIMES DAILY.  Marland Kitchen ezetimibe-simvastatin (VYTORIN) 10-40 MG per tablet Take 1 tablet by mouth at bedtime.   . famotidine (PEPCID) 40 MG tablet Take 40 mg by mouth daily.  . fexofenadine (ALLEGRA) 180 MG tablet Take 1 tablet (180 mg total) by mouth daily.  . Glucosamine-Chondroit-Vit C-Mn (GLUCOSAMINE 1500 COMPLEX PO) Take 1 tablet by mouth 2 (two) times a day.  . metoprolol tartrate (LOPRESSOR) 25 MG tablet TAKE 1/2 TABLET BY MOUTH 2 TIMES DAILY.  . metroNIDAZOLE (METROGEL) 0.75 % gel Apply 1 application topically 2 (two) times daily.  . montelukast (SINGULAIR) 10 MG tablet Take 10 mg by mouth at bedtime as needed (seasonal allergies).   . Multiple Vitamin (MULTIVITAMIN WITH MINERALS) TABS tablet Take 1 tablet by mouth daily.  Marland Kitchen olopatadine (PATANOL) 0.1 % ophthalmic solution Place 1 drop into both eyes 2 (two) times a day.  Marland Kitchen Spacer/Aero-Holding Chambers (AEROCHAMBER MV) inhaler Use as instructed  . SYNTHROID 88 MCG tablet Take 88 mcg by mouth daily.     Allergies:   Adhesive [tape], Avelox [moxifloxacin hcl in nacl], Tussionex  pennkinetic er [hydrocod polst-cpm polst er], Codeine, and Penicillins   Social History   Tobacco Use  . Smoking status: Former Smoker    Packs/day: 0.90    Years: 25.00    Pack years: 22.50    Types: Cigarettes    Quit date: 12/28/1981    Years since quitting: 38.2  . Smokeless tobacco: Never Used  Vaping Use  . Vaping Use: Never used  Substance Use Topics  . Alcohol use: Yes    Comment: several  . Drug use: No     Family Hx: The patient's family history includes Cancer in her brother, father, mother, and paternal grandfather; Diabetes in her paternal grandmother; Heart attack in her maternal grandfather; Kidney failure in her paternal grandmother.  ROS:     Please see the history of present illness.    General:no colds or fevers, no weight changes Skin:no rashes or ulcers HEENT:no blurred vision, no congestion CV:see HPI PUL:see HPI GI:no diarrhea constipation or melena, no indigestion GU:no hematuria, no dysuria MS:no joint pain, no claudication Neuro:no syncope, no lightheadedness Endo:no diabetes, + thyroid disease  All other systems reviewed and are negative.   Prior CV studies:   The following studies were reviewed today:  Echo 12/11/18 IMPRESSIONS    1. The left ventricle has normal systolic function with an ejection fraction of 60-65%. The cavity size was normal. Left ventricular diastolic function could not be evaluated secondary to atrial fibrillation.  2. The right ventricle has normal systolic function. The cavity was normal. There is no increase in right ventricular wall thickness.  FINDINGS  Left Ventricle: The left ventricle has normal systolic function, with an ejection fraction of 60-65%. The cavity size was normal. There is no increase in left ventricular wall thickness. Left ventricular diastolic function could not be evaluated  secondary to atrial fibrillation. Definity contrast agent was given IV to delineate the left ventricular endocardial borders. Right Ventricle: The right ventricle has normal systolic function. The cavity was normal. There is no increase in right ventricular wall thickness. Left Atrium: left atrial size was normal in size Right Atrium: right atrial size was normal in size. Right atrial pressure is estimated at 3 mmHg. Interatrial Septum: No atrial level shunt detected by color flow Doppler. Pericardium: There is no evidence of pericardial effusion. Mitral Valve: The mitral valve is normal in structure. Mitral valve regurgitation is trivial by color flow Doppler. Tricuspid Valve: The tricuspid valve is normal in structure. Tricuspid valve regurgitation is trivial by color flow  Doppler. Aortic Valve: The aortic valve is normal in structure. Aortic valve regurgitation was not assessed by color flow Doppler. Pulmonic Valve: The pulmonic valve was normal in structure. Pulmonic valve regurgitation is trivial by color flow Doppler. Venous: The inferior vena cava is normal in size with greater than 50% respiratory variability.     Labs/Other Tests and Data Reviewed:    EKG:  08/19/19 afib rate 59  Recent Labs: 04/24/2019: ALT 11; BUN 16; Creatinine, Ser 0.86; Hemoglobin 14.4; Platelets 247; Potassium 4.4; Sodium 140; TSH 1.270   Recent Lipid Panel Lab Results  Component Value Date/Time   CHOL 172 04/24/2019 09:22 AM   TRIG 112 04/24/2019 09:22 AM   HDL 71 04/24/2019 09:22 AM   CHOLHDL 2.4 04/24/2019 09:22 AM   LDLCALC 79 04/24/2019 09:22 AM    Wt Readings from Last 3 Encounters:  03/22/20 166 lb (75.3 kg)  08/19/19 170 lb (77.1 kg)  08/12/19 170 lb (77.1 kg)  Objective:    Vital Signs:  BP 114/72   Pulse (!) 57   Ht 5\' 5"  (1.651 m)   Wt 166 lb (75.3 kg)   SpO2 98%   BMI 27.62 kg/m    Affect appropriate Healthy:  appears stated age HEENT: normal Neck supple with no adenopathy JVP normal no bruits no thyromegaly Lungs clear with no wheezing and good diaphragmatic motion Heart:  S1/S2 no murmur, no rub, gallop or click PMI normal Abdomen: benighn, BS positve, no tenderness, no AAA no bruit.  No HSM or HJR Distal pulses intact with no bruits No edema Neuro non-focal Skin warm and dry No muscular weakness   ASSESSMENT & PLAN:    1. A fib permanent -  2017  found to be in a fib rate controlled and asymptomatic and remains the same.  2. Anticoagulation on Eliquis and no bleeding 3. HLD well controlled 4. Hypothyroidism TSH normal 04/24/19    Medication Adjustments/Labs and Tests Ordered: Current medicines are reviewed at length with the patient today.  Concerns regarding medicines are outlined above.   Tests Ordered: No orders of the  defined types were placed in this encounter.   Medication Changes: No orders of the defined types were placed in this encounter.   Disposition:  Follow up in a year   Signed, Jenkins Rouge, MD  03/22/2020 10:45 AM    [place]

## 2020-03-17 ENCOUNTER — Ambulatory Visit: Payer: Self-pay | Admitting: Podiatry

## 2020-03-22 ENCOUNTER — Encounter: Payer: Self-pay | Admitting: Cardiovascular Disease

## 2020-03-22 ENCOUNTER — Ambulatory Visit: Payer: Medicare Other | Admitting: Cardiovascular Disease

## 2020-03-22 ENCOUNTER — Other Ambulatory Visit: Payer: Self-pay

## 2020-03-22 VITALS — BP 114/72 | HR 57 | Ht 65.0 in | Wt 166.0 lb

## 2020-03-22 DIAGNOSIS — I482 Chronic atrial fibrillation, unspecified: Secondary | ICD-10-CM | POA: Diagnosis not present

## 2020-03-22 NOTE — Patient Instructions (Signed)

## 2020-04-12 ENCOUNTER — Other Ambulatory Visit: Payer: Self-pay | Admitting: Cardiovascular Disease

## 2020-04-12 NOTE — Telephone Encounter (Signed)
Eliquis 5mg  refill request received. Patient is 79 years old, weight-75.3kg, Crea-0.81 on 03/22/2020 via [place] at [facility] (no other name included), Diagnosis-Afib, and last seen by Dr. Johnsie Cancel on 03/03/2020. Dose is appropriate based on dosing criteria. Will send in refill to requested pharmacy.

## 2020-05-26 ENCOUNTER — Ambulatory Visit: Payer: Medicare Other | Admitting: Podiatry

## 2020-05-26 ENCOUNTER — Other Ambulatory Visit: Payer: Self-pay

## 2020-05-26 DIAGNOSIS — L600 Ingrowing nail: Secondary | ICD-10-CM

## 2020-05-26 DIAGNOSIS — L603 Nail dystrophy: Secondary | ICD-10-CM

## 2020-05-26 NOTE — Patient Instructions (Signed)

## 2020-05-28 ENCOUNTER — Encounter: Payer: Self-pay | Admitting: Podiatry

## 2020-05-28 NOTE — Progress Notes (Signed)
Subjective:  Patient ID: Tracy [name], female    DOB: 1941-07-29,  MRN: [id number]  Chief Complaint  Patient presents with   Nail Problem    Pt states bilateral 1st toenails are painful, right 1st has had blood drainage recently.     79 y.o. female presents with the above complaint. Patient presents with thickened elongated dystrophic bilateral hallux. Patient states that they are painful to touch. They have gotten really thick she is not able to cut them. She would like to have them removed. She had nails follow-up before grew back the same way. She denies any other acute complaints. She states it hurts when ambulating. Her pain scale 7 out of 10.   Review of Systems: Negative except as noted in the HPI. Denies N/V/F/Ch.  Past Medical History:  Diagnosis Date   Atrial fibrillation [facility]) March 2017   unknown duration. CHADs VASc =3   Colon polyps    Duodenal adenoma    GERD (gastroesophageal reflux disease)    Graves disease    Hyperlipidemia    Hypertension 04/03/2019   Hypothyroidism    LPRD (laryngopharyngeal reflux disease)    Mitral valve prolapse    Rosacea     Current Outpatient Medications:    acetaminophen (TYLENOL) 500 MG tablet, Take 1,000 mg by mouth every 6 (six) hours as needed (pain)., Disp: , Rfl:    Calcium Carbonate-Vitamin D (CALCIUM-VITAMIN D) 600-200 MG-UNIT CAPS, Take 1 tablet by mouth 2 (two) times daily. , Disp: , Rfl:    docusate sodium (COLACE) 100 MG capsule, Take 1 capsule (100 mg total) by mouth 2 (two) times daily., Disp: 10 capsule, Rfl: 0   ELIQUIS 5 MG TABS tablet, TAKE 1 TABLET BY MOUTH 2 TIMES DAILY., Disp: 180 tablet, Rfl: 2   ezetimibe-simvastatin (VYTORIN) 10-40 MG per tablet, Take 1 tablet by mouth at bedtime. , Disp: , Rfl:    famotidine (PEPCID) 40 MG tablet, Take 40 mg by mouth daily., Disp: , Rfl:    fexofenadine (ALLEGRA) 180 MG tablet, Take 1 tablet (180 mg total) by mouth daily., Disp: 90 tablet, Rfl: 3    Glucosamine-Chondroit-Vit C-Mn (GLUCOSAMINE 1500 COMPLEX PO), Take 1 tablet by mouth 2 (two) times a day., Disp: , Rfl:    metoprolol tartrate (LOPRESSOR) 25 MG tablet, TAKE 1/2 TABLET BY MOUTH 2 TIMES DAILY., Disp: 90 tablet, Rfl: 1   metroNIDAZOLE (METROGEL) 0.75 % gel, Apply 1 application topically 2 (two) times daily., Disp: , Rfl:    montelukast (SINGULAIR) 10 MG tablet, Take 10 mg by mouth at bedtime as needed (seasonal allergies). , Disp: , Rfl: 8   Multiple Vitamin (MULTIVITAMIN WITH MINERALS) TABS tablet, Take 1 tablet by mouth daily., Disp: , Rfl:    olopatadine (PATANOL) 0.1 % ophthalmic solution, Place 1 drop into both eyes 2 (two) times a day., Disp: , Rfl:    Spacer/Aero-Holding Chambers (AEROCHAMBER MV) inhaler, Use as instructed, Disp: 1 each, Rfl: 0   SYNTHROID 88 MCG tablet, Take 88 mcg by mouth daily., Disp: , Rfl:   Social History   Tobacco Use  Smoking Status Former Smoker   Packs/day: 0.90   Years: 25.00   Pack years: 22.50   Types: Cigarettes   Quit date: 12/28/1981   Years since quitting: 38.4  Smokeless Tobacco Never Used    Allergies  Allergen Reactions   Adhesive [Tape]     Surgical tape   Avelox [Moxifloxacin Hcl In Nacl] Other (See Comments)    N/V/rash/swelling  Tussionex Pennkinetic Er [Hydrocod Polst-Cpm Polst Er] Itching and Nausea And Vomiting   Codeine Itching, Nausea And Vomiting and Rash    ALL FORMS OF CODEINE   Penicillins Itching, Nausea And Vomiting, Swelling and Rash    ALL FORMS OF PENICILLINS Has patient had a PCN reaction causing immediate rash, facial/tongue/throat swelling, SOB or lightheadedness with hypotension: Yes Has patient had a PCN reaction causing severe rash involving mucus membranes or skin necrosis: No Has patient had a PCN reaction that required hospitalization No Has patient had a PCN reaction occurring within the last 10 years: No If all of the above answers are "NO", then may proceed with  Cephalosporin use.   Objective:  There were no vitals filed for this visit. There is no height or weight on file to calculate BMI. Constitutional Well developed. Well nourished.  Vascular Dorsalis pedis pulses palpable bilaterally. Posterior tibial pulses palpable bilaterally. Capillary refill normal to all digits.  No cyanosis or clubbing noted. Pedal hair growth normal.  Neurologic Normal speech. Oriented to person, place, and time. Epicritic sensation to light touch grossly present bilaterally.  Dermatologic Pain on palpation of the entire/total nail on 1st digit of the bilaterally No other open wounds. No skin lesions.  Orthopedic: Normal joint ROM without pain or crepitus bilaterally. No visible deformities. No bony tenderness.   Radiographs: None Assessment:   1. Nail dystrophy   2. Ingrown nail    Plan:  Patient was evaluated and treated and all questions answered.  Nail contusion/dystrophy hallux, bilaterally -Patient elects to proceed with minor surgery to remove entire toenail today. Consent reviewed and signed by patient. -Entire/total nail excised. See procedure note. -Educated on post-procedure care including soaking. Written instructions provided and reviewed. -Patient to follow up in 2 weeks for nail check.  Procedure: Excision of entire/total nail  Location: Bilateral 1st toe digit Anesthesia: Lidocaine 1% plain; 1.5 mL and Marcaine 0.5% plain; 1.5 mL, digital block. Skin Prep: Betadine. Dressing: Silvadene; telfa; dry, sterile, compression dressing. Technique: Following skin prep, the toe was exsanguinated and a tourniquet was secured at the base of the toe. The affected nail border was freed and excised. The tourniquet was then removed and sterile dressing applied. Disposition: Patient tolerated procedure well. Patient to return in 2 weeks for follow-up.   No follow-ups on file.

## 2020-06-14 ENCOUNTER — Other Ambulatory Visit: Payer: Self-pay

## 2020-06-14 ENCOUNTER — Encounter: Payer: Self-pay | Admitting: Podiatry

## 2020-06-14 ENCOUNTER — Ambulatory Visit: Payer: Medicare Other | Admitting: Podiatry

## 2020-06-14 DIAGNOSIS — L600 Ingrowing nail: Secondary | ICD-10-CM

## 2020-06-14 DIAGNOSIS — L603 Nail dystrophy: Secondary | ICD-10-CM

## 2020-06-14 NOTE — Progress Notes (Signed)
Subjective: Tracy [name] is a 79 y.o.  female returns to office today for follow up evaluation after having bilateral 1st total nail avulsion performed. Patient has been soaking using epsom salt and applying topical antibiotic covered with bandaid daily. Patient denies fevers, chills, nausea, vomiting. Denies any calf pain, chest pain, SOB.   Objective:  Vitals: Reviewed  General: Well developed, nourished, in no acute distress, alert and oriented x3   Dermatology: Skin is warm, dry and supple bilateral. bilateral 1st nail bed appears to be clean, dry, with mild granular tissue and surrounding scab. There is no surrounding erythema, edema, drainage/purulence. The remaining nails appear unremarkable at this time. There are no other lesions or other signs of infection present.  Neurovascular status: Intact. No lower extremity swelling; No pain with calf compression bilateral.  Musculoskeletal: Decreased tenderness to palpation of the bilateral 1st nail bed. Muscular strength within normal limits bilateral.   Assesement and Plan: S/p total nail avulsion, doing well.   -Continue soaking in epsom salts twice a day followed by antibiotic ointment and a band-aid. Can leave uncovered at night. Continue this until completely healed.  -If the area has not healed in 2 weeks, call the office for follow-up appointment, or sooner if any problems arise.  -Monitor for any signs/symptoms of infection. Call the office immediately if any occur or go directly to the emergency room. Call with any questions/concerns.  Boneta Lucks, DPM

## 2020-07-22 ENCOUNTER — Other Ambulatory Visit: Payer: Self-pay | Admitting: Cardiovascular Disease

## 2021-01-24 ENCOUNTER — Other Ambulatory Visit: Payer: Self-pay | Admitting: Endocrinology

## 2021-01-24 ENCOUNTER — Other Ambulatory Visit (HOSPITAL_COMMUNITY): Payer: Self-pay | Admitting: Endocrinology

## 2021-01-24 DIAGNOSIS — R443 Hallucinations, unspecified: Secondary | ICD-10-CM

## 2021-01-24 DIAGNOSIS — R296 Repeated falls: Secondary | ICD-10-CM

## 2021-01-25 ENCOUNTER — Other Ambulatory Visit: Payer: Self-pay

## 2021-01-25 ENCOUNTER — Ambulatory Visit (HOSPITAL_COMMUNITY)
Admission: RE | Admit: 2021-01-25 | Discharge: 2021-01-25 | Disposition: A | Discharge status: Home / Self Care | Payer: Medicare Other | Source: Ambulatory Visit | Attending: Endocrinology | Admitting: Endocrinology

## 2021-01-25 DIAGNOSIS — R296 Repeated falls: Secondary | ICD-10-CM | POA: Diagnosis present

## 2021-01-25 DIAGNOSIS — R443 Hallucinations, unspecified: Secondary | ICD-10-CM | POA: Diagnosis present

## 2021-01-25 MED ORDER — GADOBUTROL 1 MMOL/ML IV SOLN
7.5000 mL | Freq: Once | INTRAVENOUS | Status: AC | PRN
  Administered 2021-01-25: 7.5 mL via INTRAVENOUS

## 2021-02-05 ENCOUNTER — Ambulatory Visit (INDEPENDENT_AMBULATORY_CARE_PROVIDER_SITE_OTHER): Payer: Medicare Other

## 2021-02-05 ENCOUNTER — Other Ambulatory Visit: Payer: Self-pay

## 2021-02-05 ENCOUNTER — Ambulatory Visit
Admission: RE | Admit: 2021-02-05 | Discharge: 2021-02-05 | Disposition: A | Discharge status: Home / Self Care | Payer: Medicare Other | Source: Ambulatory Visit | Attending: Family Medicine | Admitting: Family Medicine

## 2021-02-05 VITALS — BP 109/78 | HR 58 | Temp 98.2°F | Resp 22

## 2021-02-05 DIAGNOSIS — W19XXXA Unspecified fall, initial encounter: Secondary | ICD-10-CM

## 2021-02-05 DIAGNOSIS — R441 Visual hallucinations: Secondary | ICD-10-CM | POA: Diagnosis not present

## 2021-02-05 DIAGNOSIS — R9389 Abnormal findings on diagnostic imaging of other specified body structures: Secondary | ICD-10-CM

## 2021-02-05 DIAGNOSIS — S20211A Contusion of right front wall of thorax, initial encounter: Secondary | ICD-10-CM | POA: Diagnosis not present

## 2021-02-05 DIAGNOSIS — R0781 Pleurodynia: Secondary | ICD-10-CM | POA: Diagnosis not present

## 2021-02-05 HISTORY — DX: Hallucinations, unspecified: R44.3

## 2021-02-05 LAB — POCT URINALYSIS DIP (MANUAL ENTRY)
Bilirubin, UA: NEGATIVE
Glucose, UA: NEGATIVE mg/dL
Leukocytes, UA: NEGATIVE
Nitrite, UA: NEGATIVE
Protein Ur, POC: NEGATIVE mg/dL
Spec Grav, UA: 1.02 (ref 1.010–1.025)
Urobilinogen, UA: 0.2 E.U./dL
pH, UA: 5.5 (ref 5.0–8.0)

## 2021-02-05 NOTE — ED Triage Notes (Signed)
Pt fell out of bed approx 9 days ago.  C/O pain from right torso wrapping around to right posterior torso since then; small area ecchymosis noted to right posterior ribcage.  Denies any SOB.  C/O painful breathing.  6 days ago has had hallucinations x months "that have gotten much much worse over past week"; has been in contact with PCP and will see neurologist soon.

## 2021-02-07 NOTE — ED Provider Notes (Signed)
[name]   [id number] 02/05/21 Arrival Time: 5852  ASSESSMENT & PLAN:  1. Rib contusion, right, initial encounter   2. Hallucinations, visual   3. Abnormal finding on chest xray    I have personally viewed the imaging studies ordered this visit. No apparent rib fractures. No pneumothorax. See radiology report for incidental abnormal findings. Copy of report given to caregiver; to discuss with PCP.  Tylenol as needed.  Unclear why hallucinations have worsened recently. No current symptoms in office. Has attentive caregiver. Will be seeing neurology. U/A without signs of infection.  Stable here. Caregiver comfortable with home observation. Other option is ED evaluation.  Recommend:  Follow-up Information    Schedule an appointment as soon as possible for a visit  with Lorrene Reid, PA-C.   Specialty: Physician Assistant Contact information: [place] [address] [phone]               Reviewed expectations re: course of current medical issues. Questions answered. Outlined signs and symptoms indicating need for more acute intervention. Patient verbalized understanding. After Visit Summary given.  SUBJECTIVE: History from: patient. Tracy [name] is a 80 y.o. female whose caregiver reports patient ell out of bed approx 9 days ago. C/O pain from right torso wrapping around to right posterior torso since then; small area ecchymosis noted to right posterior ribcage. Denies any SOB/trouble breathing. "Hurts when I take a deep breath". Tylenol with some relief.  Also reports exacerbation of visual hallucinations over past week; noticed before fall. Caregiver has been in contact with PCP and will see neurologist soon. No extremity sensation changes or weakness. No head injury reported. No abdominal pain.  OBJECTIVE:  Vitals:   02/05/21 1203  BP: 109/78  Pulse: (!) 58  Resp: (!) 22  Temp: 98.2 F (36.8 C)  TempSrc:  Temporal  SpO2: 93%    Recheck RR: 18  General appearance: alert; no distress HEENT: normocephalic; atraumatic; conjunctivae normal; no orbital bruising or tenderness to palpation; TMs normal; no bleeding from ears; oral mucosa normal Neck: supple with FROM but moves slowly; no midline tenderness Lungs: clear to auscultation bilaterally; unlabored Heart: regular Chest wall: with tenderness to palpation over R posterior ribs with approx 2x3 area of bruising Abdomen: soft, non-tender; no bruising Back: no midline tenderness Extremities: moves all extremities normally; no edema; symmetrical with no gross deformities Skin: warm and dry; without open wounds Neurologic: gait normal but slow; normal sensation and strength of all extremities Psychological: alert and cooperative; normal mood and affect  Results for orders placed or performed during the hospital encounter of 02/05/21  POCT urinalysis dipstick  Result Value Ref Range   Color, UA yellow yellow   Clarity, UA clear clear   Glucose, UA negative negative mg/dL   Bilirubin, UA negative negative   Ketones, POC UA trace (5) (A) negative mg/dL   Spec Grav, UA 1.020 1.010 - 1.025   Blood, UA moderate (A) negative   pH, UA 5.5 5.0 - 8.0   Protein Ur, POC negative negative mg/dL   Urobilinogen, UA 0.2 0.2 or 1.0 E.U./dL   Nitrite, UA Negative Negative   Leukocytes, UA Negative Negative     DG Ribs Unilateral W/Chest Right  Result Date: 02/05/2021 CLINICAL DATA:  Fall 9 days prior with right lower posterior rib pain EXAM: RIGHT RIBS AND CHEST - 3+ VIEW COMPARISON:  12/15/2015 chest radiograph. FINDINGS: Stable cardiomediastinal silhouette with normal heart size. No pneumothorax. No pleural effusion.  Mild patchy peripheral reticular opacities in both lungs, similar to slightly worsened. No pulmonary edema. No acute consolidative airspace disease. The area of symptomatic concern as indicated by the patient in the lower right chest wall  was denoted with a metallic skin BB by the technologist. No fracture or focal osseous lesion is seen in the right ribs. Posterior lower ribs are obscured by normal chondral calcification in the anterior lower right ribs. IMPRESSION: 1. No right rib fracture detected. Should the patient's symptoms persist or worsen, repeat radiographs of the ribs in 10 - 14 days or noncontrast chest CT maybe of use to detect subtle nondisplaced rib fractures (which are commonly occult on initial imaging). 2. Mild patchy peripheral reticular opacities in both lungs, similar to slightly worsened, cannot exclude interstitial lung disease. High-resolution chest CT could be obtained for further evaluation as clinically warranted. Electronically Signed   By: Ilona Sorrel M.D.   On: 02/05/2021 13:09    Allergies  Allergen Reactions  . Adhesive [Tape]     Surgical tape  . Avelox [Moxifloxacin Hcl In Nacl] Other (See Comments)    N/V/rash/swelling  . Tussionex Pennkinetic Er [Hydrocod Polst-Cpm Polst Er] Itching and Nausea And Vomiting  . Codeine Itching, Nausea And Vomiting and Rash    ALL FORMS OF CODEINE  . Penicillins Itching, Nausea And Vomiting, Swelling and Rash    ALL FORMS OF PENICILLINS Has patient had a PCN reaction causing immediate rash, facial/tongue/throat swelling, SOB or lightheadedness with hypotension: Yes Has patient had a PCN reaction causing severe rash involving mucus membranes or skin necrosis: No Has patient had a PCN reaction that required hospitalization No Has patient had a PCN reaction occurring within the last 10 years: No If all of the above answers are "NO", then may proceed with Cephalosporin use.   Past Medical History:  Diagnosis Date  . Atrial fibrillation [facility]) March 2017   unknown duration. CHADs VASc =3  . Colon polyps   . Duodenal adenoma   . GERD (gastroesophageal reflux disease)   . Graves disease   . Hallucinations   . Hyperlipidemia   . Hypertension 04/03/2019  .  Hypothyroidism   . LPRD (laryngopharyngeal reflux disease)   . Mitral valve prolapse   . Rosacea    Past Surgical History:  Procedure Laterality Date  . APPENDECTOMY    . CATARACT EXTRACTION, BILATERAL    . COLONOSCOPY     x2  . ESOPHAGOGASTRODUODENOSCOPY     x2  . LAPAROTOMY N/A 12/09/2015   Procedure: EXPLORATORY LAPAROTOMY, LYSIS OF ADHESIONS ;  Surgeon: [name], MD;  Location: [facility];  Service: General;  Laterality: N/A;  . PILONIDAL CYST / SINUS EXCISION    . ruptured disk    . TUBAL LIGATION     Family History  Problem Relation Age of Onset  . Cancer Father        lung  . Cancer Mother        lung  . Cancer Paternal Grandfather        stomach  . Diabetes Paternal Grandmother   . Kidney failure Paternal Grandmother   . Heart attack Maternal Grandfather   . Cancer Brother        lung   Social History   Socioeconomic History  . Marital status: Widowed    Spouse name: Not on file  . Number of children: 3  . Years of education: Not on file  . Highest education level: Not on file  Occupational History  .  Occupation: retired    Comment: Network engineer  Tobacco Use  . Smoking status: Former Smoker    Packs/day: 0.90    Years: 25.00    Pack years: 22.50    Types: Cigarettes    Quit date: 12/28/1981    Years since quitting: 39.1  . Smokeless tobacco: Never Used  Vaping Use  . Vaping Use: Never used  Substance and Sexual Activity  . Alcohol use: Yes    Alcohol/week: 7.0 standard drinks    Types: 7 Glasses of wine per week  . Drug use: No  . Sexual activity: Not on file  Other Topics Concern  . Not on file  Social History Narrative  . Not on file   Social Determinants of Health   Financial Resource Strain: Not on file  Food Insecurity: Not on file  Transportation Needs: Not on file  Physical Activity: Not on file  Stress: Not on file  Social Connections: Not on file          Vanessa Kick, MD 02/07/21 [phone]

## 2021-02-08 ENCOUNTER — Encounter: Payer: Self-pay | Admitting: Neurology

## 2021-02-08 ENCOUNTER — Other Ambulatory Visit: Payer: Self-pay

## 2021-02-08 ENCOUNTER — Ambulatory Visit: Payer: Medicare Other | Admitting: Neurology

## 2021-02-08 VITALS — BP 119/69 | HR 54 | Ht 65.0 in | Wt 155.5 lb

## 2021-02-08 DIAGNOSIS — R269 Unspecified abnormalities of gait and mobility: Secondary | ICD-10-CM | POA: Diagnosis not present

## 2021-02-08 DIAGNOSIS — F0390 Unspecified dementia without behavioral disturbance: Secondary | ICD-10-CM | POA: Diagnosis not present

## 2021-02-08 DIAGNOSIS — R441 Visual hallucinations: Secondary | ICD-10-CM

## 2021-02-08 MED ORDER — DONEPEZIL HCL 10 MG PO TABS: 10.0000 mg | ORAL_TABLET | Freq: Every day | ORAL | 11 refills | Status: DC

## 2021-02-08 NOTE — Progress Notes (Signed)
Chief Complaint  Patient presents with  . New Patient (Initial Visit)    She is here with her daughter, Ginger. Reports falling out of the bed 5-6 times over the last 1.5 years. Says she wakes up on the way down to the floor. She has also noticed memory changes and visual hallucinations (seeing dead family members in her home). She lives alone. She is not able to balance checkbook now. She takes her medications inconsistently. MoCA: /30.       ASSESSMENT AND PLAN  Tracy [name] is a 80 y.o. female   Dementia with visual hallucination Gait abnormality  Slow progress memory loss, also worsening visual hallucinations, mild unsteady gait, with moderate retropulse instability, I did not see other significant parkinsonian features on today's examination  Consistent with central nervous system degenerative disorder,  MRI of the brain was normal  Will get lab result from her primary care physician,  Starting Aricept 10 mg daily  Encouraged her moderate exercise, return to clinic with nurse practitioner Sarah in 4 months,  Also filled FL2 paperwork for long term care facility  DIAGNOSTIC DATA (LABS, IMAGING, TESTING) - I reviewed patient records, labs, notes, testing and imaging myself where available.  MRI of the brain with without contrast January 25, 2021, No acute abnormality  Very mild white matter changes, less than expected for age.  HISTORICAL  Tracy [name] is a 80 year old female, seen in request by her primary care doctor Reynold Bowen for evaluation of unsteady gait, memory loss, hallucinations, initial evaluation with her daughter Ginger on Feb 08, 2021  I reviewed and summarized the referring note.  Past medical history Hypothyroidism, on supplement Mitral valve prolapse, Atrial fibrillation, on chronic Eliquis treatment Duodenal adenoma, Hyperlipidemia  Her daughter lives at [facility], patient lives by herself since her husband passed away in January 17, 2010, she was a  homemaker,enjoyed playing basketball, dance in the past, used to walk few miles each day, but was noted to become more sedentary over the past couple years, she still drive short distance during the daytime to [employer], friends home, grocery, she likes to read, recently less physically active reported right shoulder pain  She was noted to have worsening memory loss since fall 2021, she was not able to balance her checkbook, has to call her daughter to help, also noted to have difficulty keeping up with her medication schedule, she realized she was dealing with memory loss, reported difficulty to carry on a quick conversation, word finding difficulties, there was no family history of memory loss  She also reported gradual worsening visual hallucination, she often seeing her past family members, since 01/17/21, she also began to see outside people coming to her home, trying to take object from her home, this has annoying her,  We personally reviewed MRI of the brain with without contrast on January 25, 2021, that was essentially normal, age-appropriate  We also spent lengthy time discussion long-term arrangement, patient's daughter Ginger, increasingly concerned that she lives alone, was not able to manage her meals on time, making mistakes in her medications, changes already researched some facility close to her home, after discussed with patient, and her daughter, they both agree for long-term retirement community, I filled FL 2 paperwork for them today,  REVIEW OF SYSTEMS:  Full 14 system review of systems performed and notable only for as above All other review of systems were negative.  PHYSICAL EXAM:   Vitals:   02/08/21 1031  BP: 119/69  Pulse: Marland Kitchen)  54  Weight: 155 lb 8 oz (70.5 kg)  Height: 5\' 5"  (1.651 m)   Not recorded     Body mass index is 25.88 kg/m.  PHYSICAL EXAMNIATION:  Gen: NAD, conversant, well nourised, well groomed                     Cardiovascular: Regular rate  rhythm, no peripheral edema, warm, nontender. Eyes: Conjunctivae clear without exudates or hemorrhage Neck: Supple, no carotid bruits. Pulmonary: Clear to auscultation bilaterally   NEUROLOGICAL EXAM:  MENTAL STATUS: Speech:    Speech is normal; fluent and spontaneous with normal comprehension.   Montreal Cognitive Assessment  02/08/2021  Visuospatial/ Executive (0/5) 2  Naming (0/3) 2  Attention: Read list of digits (0/2) 2  Attention: Read list of letters (0/1) 1  Attention: Serial 7 subtraction starting at 100 (0/3) 1  Language: Repeat phrase (0/2) 2  Language : Fluency (0/1) 0  Abstraction (0/2) 2  Delayed Recall (0/5) 1  Orientation (0/6) 4  Total 17  Adjusted Score (based on education) 17   CRANIAL NERVES: CN II: Visual fields are full to confrontation. Pupils are round equal and briskly reactive to light. CN III, IV, VI: extraocular movement are normal. No ptosis. CN V: Facial sensation is intact to light touch CN VII: Face is symmetric with normal eye closure  CN VIII: Hearing is normal to causal conversation. CN IX, X: Phonation is normal. CN XI: Head turning and shoulder shrug are intact  MOTOR: Normal muscle strength, no significant bradykinesia, slight right more than left rigidity,  REFLEXES: Reflexes are 1 and symmetric at the biceps, triceps, knees, and ankles. Plantar responses are flexor.  SENSORY: Intact to light touch, pinprick and vibratory sensation are intact in fingers and toes.  COORDINATION: There is no trunk or limb dysmetria noted.  GAIT/STANCE: She can get up from seated position arm crossed, moderate stride, mildly decreased arm swing, especially on the right side, where she had right shoulder pain, mild to moderate retropulsion stability  ALLERGIES: Allergies  Allergen Reactions  . Adhesive [Tape]     Surgical tape  . Avelox [Moxifloxacin Hcl In Nacl] Other (See Comments)    N/V/rash/swelling  . Tussionex Pennkinetic Er [Hydrocod  Polst-Cpm Polst Er] Itching and Nausea And Vomiting  . Codeine Itching, Nausea And Vomiting and Rash    ALL FORMS OF CODEINE  . Penicillins Itching, Nausea And Vomiting, Swelling and Rash    ALL FORMS OF PENICILLINS Has patient had a PCN reaction causing immediate rash, facial/tongue/throat swelling, SOB or lightheadedness with hypotension: Yes Has patient had a PCN reaction causing severe rash involving mucus membranes or skin necrosis: No Has patient had a PCN reaction that required hospitalization No Has patient had a PCN reaction occurring within the last 10 years: No If all of the above answers are "NO", then may proceed with Cephalosporin use.    HOME MEDICATIONS: Current Outpatient Medications  Medication Sig Dispense Refill  . acetaminophen (TYLENOL) 500 MG tablet Take 1,000 mg by mouth every 6 (six) hours as needed (pain).    Marland Kitchen ELIQUIS 5 MG TABS tablet TAKE 1 TABLET BY MOUTH 2 TIMES DAILY. 180 tablet 2  . famotidine (PEPCID) 40 MG tablet Take 40 mg by mouth daily.    . metoprolol tartrate (LOPRESSOR) 25 MG tablet TAKE 1/2 TABLET BY MOUTH 2 TIMES DAILY. 90 tablet 2  . montelukast (SINGULAIR) 10 MG tablet Take 10 mg by mouth at bedtime as needed (seasonal allergies).  8  . Multiple Vitamin (MULTIVITAMIN WITH MINERALS) TABS tablet Take 1 tablet by mouth daily.    Marland Kitchen Spacer/Aero-Holding Chambers (AEROCHAMBER MV) inhaler Use as instructed 1 each 0  . SYNTHROID 88 MCG tablet Take 88 mcg by mouth daily.    . traZODone (DESYREL) 50 MG tablet Take 50 mg by mouth at bedtime.     No current facility-administered medications for this visit.    PAST MEDICAL HISTORY: Past Medical History:  Diagnosis Date  . Atrial fibrillation [facility]) March 2017   unknown duration. CHADs VASc =3  . Colon polyps   . Duodenal adenoma   . GERD (gastroesophageal reflux disease)   . Graves disease   . Hallucinations   . Hyperlipidemia   . Hypertension 04/03/2019  . Hypothyroidism   . LPRD  (laryngopharyngeal reflux disease)   . Memory loss   . Mitral valve prolapse   . Rosacea     PAST SURGICAL HISTORY: Past Surgical History:  Procedure Laterality Date  . APPENDECTOMY    . CATARACT EXTRACTION, BILATERAL    . COLONOSCOPY     x2  . ESOPHAGOGASTRODUODENOSCOPY     x2  . LAPAROTOMY N/A 12/09/2015   Procedure: EXPLORATORY LAPAROTOMY, LYSIS OF ADHESIONS ;  Surgeon: [name], MD;  Location: [facility];  Service: General;  Laterality: N/A;  . PILONIDAL CYST / SINUS EXCISION    . ruptured disk    . TUBAL LIGATION      FAMILY HISTORY: Family History  Problem Relation Age of Onset  . Cancer Father        lung  . Cancer Mother        lung  . Cancer Paternal Grandfather        stomach  . Diabetes Paternal Grandmother   . Kidney failure Paternal Grandmother   . Heart attack Maternal Grandfather   . Cancer Brother        lung    SOCIAL HISTORY: Social History   Socioeconomic History  . Marital status: Widowed    Spouse name: Not on file  . Number of children: 3  . Years of education: 65  . Highest education level: High school graduate  Occupational History  . Occupation: retired    Comment: Network engineer  Tobacco Use  . Smoking status: Former Smoker    Packs/day: 0.90    Years: 25.00    Pack years: 22.50    Types: Cigarettes    Quit date: 12/28/1981    Years since quitting: 39.1  . Smokeless tobacco: Never Used  Vaping Use  . Vaping Use: Never used  Substance and Sexual Activity  . Alcohol use: Yes    Alcohol/week: 7.0 standard drinks    Types: 7 Glasses of wine per week  . Drug use: No  . Sexual activity: Not on file  Other Topics Concern  . Not on file  Social History Narrative   Lives alone.   Right-handed.    No daily use of caffeine.   Social Determinants of Health   Financial Resource Strain: Not on file  Food Insecurity: Not on file  Transportation Needs: Not on file  Physical Activity: Not on file  Stress: Not on file  Social  Connections: Not on file  Intimate Partner Violence: Not on file    Total time spent reviewing the chart, obtaining history, examined patient, ordering tests, documentation, consultations and family, care coordination was 16 minutes   Marcial Pacas, M.D. Ph.D.  [facility] [address]  [place], [place] [address] Ph: [phone] Fax: [phone]  CC:  Reynold Bowen, MD [address] [employer] [place],  [place] [address]  Lorrene Reid, PA-C

## 2021-03-11 ENCOUNTER — Telehealth: Payer: Self-pay

## 2021-03-11 ENCOUNTER — Other Ambulatory Visit: Payer: Self-pay | Admitting: Cardiovascular Disease

## 2021-03-11 NOTE — Telephone Encounter (Signed)
Pt last saw Dr Johnsie Cancel 03/22/20, will be due for 1 year follow-up this month.  Last labs 03/03/20 Creat 0.8, needs repeat blood work.  Age 80, weight 70.5kg, based on specified criteria pt is on appropriate dosage of Eliquis 5mg  BID.  Will refill rx x 1 90 day supply with note on rx to call office to schedule appt with labwork for future refills.

## 2021-03-11 NOTE — Telephone Encounter (Signed)
Message placed on scheduled appt for 07/06/21 with Dr Johnsie Cancel, pt will need CBC and BMP at [place] follow-up Eliquis labwork.

## 2021-03-11 NOTE — Telephone Encounter (Signed)
-----   Message from [facility] sent at 03/11/2021 10:26 AM EDT ----- Patient scheduled for 10/05 with Dr. Johnsie Cancel. Spoke with daughter, patient may be moving to a retirement community in [place], [place] so she may have to cancel appointment. Will keep appointment for now.  ----- Message ----- From: Brynda Peon, RN Sent: 03/11/2021  10:21 AM EDT To: Rebeca Alert [address] Scheduling  Pt last saw Dr Johnsie Cancel 03/22/20, will be due for 1 year follow-up this month.  Last labs 03/03/20 Creat 0.8, needs repeat blood work.  Pt requesting Eliquis refill.  Please call pt to schedule appt.  Once scheduled will you please let me know so I can add [place] request to appt.   Thanks, Doroteo Bradford

## 2021-03-25 ENCOUNTER — Other Ambulatory Visit: Payer: Self-pay | Admitting: Cardiovascular Disease

## 2021-06-09 ENCOUNTER — Ambulatory Visit: Payer: Medicare Other | Admitting: Neurology

## 2021-06-09 ENCOUNTER — Encounter: Payer: Self-pay | Admitting: Neurology

## 2021-06-09 VITALS — BP 131/63 | HR 51 | Ht 65.0 in | Wt 155.0 lb

## 2021-06-09 DIAGNOSIS — R269 Unspecified abnormalities of gait and mobility: Secondary | ICD-10-CM

## 2021-06-09 DIAGNOSIS — R441 Visual hallucinations: Secondary | ICD-10-CM

## 2021-06-09 DIAGNOSIS — F0390 Unspecified dementia without behavioral disturbance: Secondary | ICD-10-CM | POA: Diagnosis not present

## 2021-06-09 MED ORDER — MEMANTINE HCL 10 MG PO TABS: 10.0000 mg | ORAL_TABLET | Freq: Two times a day (BID) | ORAL | 11 refills | Status: AC

## 2021-06-09 NOTE — Patient Instructions (Signed)
Meds ordered this encounter  Medications   memantine (NAMENDA) 10 MG tablet    Sig: Take 1 tablet (10 mg total) by mouth 2 (two) times daily.    Dispense:  60 tablet    Refill:  11     

## 2021-06-09 NOTE — Progress Notes (Signed)
Chief Complaint  Patient presents with   Follow-up    New rm, with daughter, states she is doing well, reports no hallucinations       ASSESSMENT AND PLAN  Tracy [name] is a 80 y.o. female   Dementia  Gait abnormality  Consistent with central nervous system degenerative disorder,  MRI of the brain showed no significant abnormalities  Her hallucination has much improved after she is moved to current independent living, and starting Aricept 10 mg daily,  Will add on Namenda 10 mg twice a day  Continue moderate exercise, physical therapy at the facility  DIAGNOSTIC DATA (LABS, IMAGING, TESTING) - I reviewed patient records, labs, notes, testing and imaging myself where available.  MRI of the brain with without contrast January 25, 2021, No acute abnormality, Very mild white matter changes, less than expected for age.  HISTORICAL  Tracy [name] is a 80 year old female, seen in request by her primary care doctor Tracy [name] for evaluation of unsteady gait, memory loss, hallucinations, initial evaluation with her daughter Tracy [name] on Feb 08, 2021  I reviewed and summarized the referring note.  Past medical history Hypothyroidism, on supplement Mitral valve prolapse, Atrial fibrillation, on chronic Eliquis treatment Duodenal adenoma, Hyperlipidemia  Her daughter lives at [place], patient lives by herself since her husband passed away in 12/29/09, she was a homemaker,enjoyed playing basketball, dance in the past, used to walk few miles each day, but was noted to become more sedentary over the past couple years, she still drive short distance during the daytime to [employer], friends home, grocery, she likes to read, recently less physically active reported right shoulder pain  She was noted to have worsening memory loss since fall 2021, she was not able to balance her checkbook, has to call her daughter to help, also noted to have difficulty keeping up with her medication schedule, she  realized she was dealing with memory loss, reported difficulty to carry on a quick conversation, word finding difficulties, there was no family history of memory loss  She also reported gradual worsening visual hallucination, she often seeing her past family members, since April 2022, she also began to see outside people coming to her home, trying to take object from her home, this has annoying her,  We personally reviewed MRI of the brain with without contrast on January 25, 2021, that was essentially normal, age-appropriate  We also spent lengthy time discussion long-term arrangement, patient's daughter Tracy [name], increasingly concerned that she lives alone, was not able to manage her meals on time, making mistakes in her medications, changes already researched some facility close to her home, after discussed with patient, and her daughter, they both agree for long-term retirement community, I filled [place] 2 paperwork for them today,  UPDATE Sept 8 2022: She moved to retirement [employer],  she is fairly independent, but with good service, She complains of dizziness getting up from seated position, blood pressure sitting down 130/70, getting up 120/70, because of frequent nocturia, she tried to avoid fluid during the day,   REVIEW OF SYSTEMS:  Full 14 system review of systems performed and notable only for as above All other review of systems were negative.  PHYSICAL EXAM:   Vitals:   06/09/21 1107  BP: 131/63  Pulse: (!) 51  Weight: 155 lb (70.3 kg)  Height: '5\' 5"'$  (1.651 m)   Not recorded     Body mass index is 25.79 kg/m.  PHYSICAL EXAMNIATION:  Gen: NAD, conversant, well  nourised, well groomed          NEUROLOGICAL EXAM:  MENTAL STATUS: Speech:  Speech is normal; fluent and spontaneous with normal comprehension.   Montreal Cognitive Assessment  02/08/2021  Visuospatial/ Executive (0/5) 2  Naming (0/3) 2  Attention: Read list of digits (0/2) 2  Attention: Read  list of letters (0/1) 1  Attention: Serial 7 subtraction starting at 100 (0/3) 1  Language: Repeat phrase (0/2) 2  Language : Fluency (0/1) 0  Abstraction (0/2) 2  Delayed Recall (0/5) 1  Orientation (0/6) 4  Total 17  Adjusted Score (based on education) 17   CRANIAL NERVES: CN II: Visual fields are full to confrontation. Pupils are round equal and briskly reactive to light. CN III, IV, VI: extraocular movement are normal. No ptosis. CN V: Facial sensation is intact to light touch CN VII: Face is symmetric with normal eye closure  CN VIII: Hearing is normal to causal conversation. CN IX, X: Phonation is normal. CN XI: Head turning and shoulder shrug are intact  MOTOR: Normal muscle tone and strength, no significant rigidity or bradykinesia  REFLEXES: Reflexes are 1 and symmetric at the biceps, triceps, knees, and ankles. Plantar responses are flexor.  SENSORY: Intact to light touch, pinprick and vibratory sensation are intact in fingers and toes.  COORDINATION: There is no trunk or limb dysmetria noted.  GAIT/STANCE: She needs push-up to get up from seated position, leaning forward, cautious, mildly unsteady  ALLERGIES: Allergies  Allergen Reactions   Adhesive [Tape]     Surgical tape   Avelox [Moxifloxacin Hcl In Nacl] Other (See Comments)    N/V/rash/swelling   Tussionex Pennkinetic Er [Hydrocod Polst-Cpm Polst Er] Itching and Nausea And Vomiting   Codeine Itching, Nausea And Vomiting and Rash    ALL FORMS OF CODEINE   Penicillins Itching, Nausea And Vomiting, Swelling and Rash    ALL FORMS OF PENICILLINS Has patient had a PCN reaction causing immediate rash, facial/tongue/throat swelling, SOB or lightheadedness with hypotension: Yes Has patient had a PCN reaction causing severe rash involving mucus membranes or skin necrosis: No Has patient had a PCN reaction that required hospitalization No Has patient had a PCN reaction occurring within the last 10 years: No If  all of the above answers are "NO", then may proceed with Cephalosporin use.    HOME MEDICATIONS: Current Outpatient Medications  Medication Sig Dispense Refill   acetaminophen (TYLENOL) 500 MG tablet Take 1,000 mg by mouth every 6 (six) hours as needed (pain).     apixaban (ELIQUIS) 5 MG TABS tablet TAKE 1 TABLET BY MOUTH 2 TIMES DAILY. Pt Needs follow-up appt with MD and labwork for future refills. 180 tablet 0   donepezil (ARICEPT) 10 MG tablet Take 1 tablet (10 mg total) by mouth at bedtime. 30 tablet 11   ezetimibe (ZETIA) 10 MG tablet 1 tablet     famotidine (PEPCID) 40 MG tablet Take 40 mg by mouth daily.     metoprolol tartrate (LOPRESSOR) 25 MG tablet Take 0.5 tablets (12.5 mg total) by mouth 2 (two) times daily. Pt must keep upcoming appt in October 2022 with Dr. Johnsie Cancel before anymore refills. Thank you 90 tablet 0   metoprolol tartrate (LOPRESSOR) 25 MG tablet 1/2 tablet     montelukast (SINGULAIR) 10 MG tablet Take 10 mg by mouth at bedtime as needed (seasonal allergies).   8   Spacer/Aero-Holding Chambers (AEROCHAMBER MV) inhaler Use as instructed 1 each 0   SYNTHROID 88 MCG tablet  Take 88 mcg by mouth daily.     traZODone (DESYREL) 50 MG tablet Take 50 mg by mouth at bedtime.     Multiple Vitamin (MULTIVITAMIN WITH MINERALS) TABS tablet Take 1 tablet by mouth daily.     No current facility-administered medications for this visit.    PAST MEDICAL HISTORY: Past Medical History:  Diagnosis Date   Atrial fibrillation [facility]) March 2017   unknown duration. CHADs VASc =3   Colon polyps    Duodenal adenoma    GERD (gastroesophageal reflux disease)    Graves disease    Hallucinations    Hyperlipidemia    Hypertension 04/03/2019   Hypothyroidism    LPRD (laryngopharyngeal reflux disease)    Memory loss    Mitral valve prolapse    Rosacea     PAST SURGICAL HISTORY: Past Surgical History:  Procedure Laterality Date   APPENDECTOMY     CATARACT EXTRACTION, BILATERAL      COLONOSCOPY     x2   ESOPHAGOGASTRODUODENOSCOPY     x2   LAPAROTOMY N/A 12/09/2015   Procedure: EXPLORATORY LAPAROTOMY, LYSIS OF ADHESIONS ;  Surgeon: [name], MD;  Location: [facility] OR;  Service: General;  Laterality: N/A;   PILONIDAL CYST / SINUS EXCISION     ruptured disk     TUBAL LIGATION      FAMILY HISTORY: Family History  Problem Relation Age of Onset   Cancer Father        lung   Cancer Mother        lung   Cancer Paternal Grandfather        stomach   Diabetes Paternal Grandmother    Kidney failure Paternal Grandmother    Heart attack Maternal Grandfather    Cancer Brother        lung    SOCIAL HISTORY: Social History   Socioeconomic History   Marital status: Widowed    Spouse name: Not on file   Number of children: 3   Years of education: 12   Highest education level: High school graduate  Occupational History   Occupation: retired    Comment: Network engineer  Tobacco Use   Smoking status: Former    Packs/day: 0.90    Years: 25.00    Pack years: 22.50    Types: Cigarettes    Quit date: 12/28/1981    Years since quitting: 39.4   Smokeless tobacco: Never  Vaping Use   Vaping Use: Never used  Substance and Sexual Activity   Alcohol use: Yes    Alcohol/week: 7.0 standard drinks    Types: 7 Glasses of wine per week   Drug use: No   Sexual activity: Not on file  Other Topics Concern   Not on file  Social History Narrative   Lives alone.   Right-handed.    No daily use of caffeine.   Social Determinants of Health   Financial Resource Strain: Not on file  Food Insecurity: Not on file  Transportation Needs: Not on file  Physical Activity: Not on file  Stress: Not on file  Social Connections: Not on file  Intimate Partner Violence: Not on file     Marcial Pacas, M.D. Ph.D.  [facility] [address] [place], [place] [address] Ph: [phone] Fax: [phone]  CC:  Lorrene Reid, PA-C [address]  [place],  [place] [address]  Pcp, No

## 2021-06-27 ENCOUNTER — Telehealth: Payer: Self-pay | Admitting: Cardiovascular Disease

## 2021-06-27 MED ORDER — APIXABAN 5 MG PO TABS: ORAL_TABLET | ORAL | 0 refills | Status: DC

## 2021-06-27 NOTE — Telephone Encounter (Signed)
 *  STAT* If patient is at the pharmacy, call can be transferred to refill team.   1. Which medications need to be refilled? (please list name of each medication and dose if known) apixaban (ELIQUIS) 5 MG TABS tablet  2. Which pharmacy/location (including street and city if local pharmacy) is medication to be sent to? [facility] [id number] - [place], [place]  3. Do they need a 30 day or 90 day supply? 90 days  Pt is out of meds, needs refill today

## 2021-06-27 NOTE — Telephone Encounter (Signed)
Pt last saw Dr Johnsie Cancel 03/22/20, pt is overdue for follow-up.  Pt has appt scheduled for 07/06/21.  Last labs 03/03/20 Creat 0.8, needs repeat blood work.  Age 80, weight 70.3kg, based on specified criteria pt is on appropriate dosage of Eliquis 5mg  BID.  Placed note on pt appt, MUST keep follow-up for FUTURE refills.

## 2021-07-04 NOTE — Progress Notes (Signed)
Date:  07/06/2021   ID:  Tracy [name], DOB September 11, 1941, MRN [id number]  Provider Location: Office  PCP:  Merryl Hacker, No  Cardiologist:  Jenkins Rouge, MD  Electrophysiologist:  None   Evaluation Performed:  Follow-Up Visit  Chief Complaint:  Atrial fib   History of Present Illness:    Tracy [name] is a 80 y.o. female with PAF. First seen March 2017 had afib post surgery for SBO and lysis of adhesions. Started on eliquis then Echo 12/15/15 EF 55-60% normal LA size spontaneous conversion and beta blocker dose decreased    Stress from children. Daughter has a special needs child and son Has agoraphobia and having trouble with no work for 3 years   Back In afib in office 12/06/17  Asymptomatic Compliant with meds  so she has remained in a fib.  Rate controlled and anticoagulation.    No cardiac symptoms specifically no palpitaitons   No bleeding issues on eliquis  Has had COVID vaccine   TTE reviewed 12/11/18 EF 60-65% no valve dx  May had some visual hallucinations with fall and right rib pain Xrays negative For fracture  MRI brain NAD Sees Dr Zenovia Jordan neuro diagnosed with dementia gait abnormality Moved to retirement community [facility] September 2022   Needs lab work for [employer]    Past Medical History:  Diagnosis Date   Atrial fibrillation [facility]) March 2017   unknown duration. CHADs VASc =3   Colon polyps    Duodenal adenoma    GERD (gastroesophageal reflux disease)    Graves disease    Hallucinations    Hyperlipidemia    Hypertension 04/03/2019   Hypothyroidism    LPRD (laryngopharyngeal reflux disease)    Memory loss    Mitral valve prolapse    Rosacea    Past Surgical History:  Procedure Laterality Date   APPENDECTOMY     CATARACT EXTRACTION, BILATERAL     COLONOSCOPY     x2   ESOPHAGOGASTRODUODENOSCOPY     x2   LAPAROTOMY N/A 12/09/2015   Procedure: EXPLORATORY LAPAROTOMY, LYSIS OF ADHESIONS ;  Surgeon: [name], MD;  Location: [facility];  Service:  General;  Laterality: N/A;   PILONIDAL CYST / SINUS EXCISION     ruptured disk     TUBAL LIGATION       Current Meds  Medication Sig   acetaminophen (TYLENOL) 500 MG tablet Take 1,000 mg by mouth every 6 (six) hours as needed (pain).   apixaban (ELIQUIS) 5 MG TABS tablet TAKE 1 TABLET BY MOUTH 2 TIMES DAILY. Pt Needs follow-up appt with MD and labwork for future refills.   donepezil (ARICEPT) 10 MG tablet Take 1 tablet (10 mg total) by mouth at bedtime.   ezetimibe (ZETIA) 10 MG tablet 1 tablet   famotidine (PEPCID) 40 MG tablet Take 40 mg by mouth daily.   memantine (NAMENDA) 10 MG tablet Take 1 tablet (10 mg total) by mouth 2 (two) times daily.   metoprolol tartrate (LOPRESSOR) 25 MG tablet Take 0.5 tablets (12.5 mg total) by mouth 2 (two) times daily. Pt must keep upcoming appt in October 2022 with Dr. Johnsie Cancel before anymore refills. Thank you   metoprolol tartrate (LOPRESSOR) 25 MG tablet 1/2 tablet   montelukast (SINGULAIR) 10 MG tablet Take 10 mg by mouth at bedtime as needed (seasonal allergies).    Spacer/Aero-Holding Chambers (AEROCHAMBER MV) inhaler Use as instructed   SYNTHROID 88 MCG tablet Take 88 mcg by mouth daily.  traZODone (DESYREL) 50 MG tablet Take 50 mg by mouth at bedtime.     Allergies:   Adhesive [tape], Avelox [moxifloxacin hcl in nacl], Tussionex pennkinetic er [employer] polst-cpm polst er], Codeine, and Penicillins   Social History   Tobacco Use   Smoking status: Former    Packs/day: 0.90    Years: 25.00    Pack years: 22.50    Types: Cigarettes    Quit date: 12/28/1981    Years since quitting: 39.5   Smokeless tobacco: Never  Vaping Use   Vaping Use: Never used  Substance Use Topics   Alcohol use: Yes    Alcohol/week: 7.0 standard drinks    Types: 7 Glasses of wine per week   Drug use: No     Family Hx: The patient's family history includes Cancer in her brother, father, mother, and paternal grandfather; Diabetes in her paternal grandmother;  Heart attack in her maternal grandfather; Kidney failure in her paternal grandmother.  ROS:   Please see the history of present illness.    General:no colds or fevers, no weight changes Skin:no rashes or ulcers HEENT:no blurred vision, no congestion CV:see HPI PUL:see HPI GI:no diarrhea constipation or melena, no indigestion GU:no hematuria, no dysuria MS:no joint pain, no claudication Neuro:no syncope, no lightheadedness Endo:no diabetes, + thyroid disease  All other systems reviewed and are negative.   Prior CV studies:   The following studies were reviewed today:  Echo 12/11/18 IMPRESSIONS      1. The left ventricle has normal systolic function with an ejection fraction of 60-65%. The cavity size was normal. Left ventricular diastolic function could not be evaluated secondary to atrial fibrillation.  2. The right ventricle has normal systolic function. The cavity was normal. There is no increase in right ventricular wall thickness.   FINDINGS  Left Ventricle: The left ventricle has normal systolic function, with an ejection fraction of 60-65%. The cavity size was normal. There is no increase in left ventricular wall thickness. Left ventricular diastolic function could not be evaluated  secondary to atrial fibrillation. Definity contrast agent was given IV to delineate the left ventricular endocardial borders. Right Ventricle: The right ventricle has normal systolic function. The cavity was normal. There is no increase in right ventricular wall thickness. Left Atrium: left atrial size was normal in size Right Atrium: right atrial size was normal in size. Right atrial pressure is estimated at 3 mmHg. Interatrial Septum: No atrial level shunt detected by color flow Doppler. Pericardium: There is no evidence of pericardial effusion. Mitral Valve: The mitral valve is normal in structure. Mitral valve regurgitation is trivial by color flow Doppler. Tricuspid Valve: The tricuspid  valve is normal in structure. Tricuspid valve regurgitation is trivial by color flow Doppler. Aortic Valve: The aortic valve is normal in structure. Aortic valve regurgitation was not assessed by color flow Doppler. Pulmonic Valve: The pulmonic valve was normal in structure. Pulmonic valve regurgitation is trivial by color flow Doppler. Venous: The inferior vena cava is normal in size with greater than 50% respiratory variability.     Labs/Other Tests and Data Reviewed:    EKG:  08/19/19 afib rate 59  07/06/2021 afib rate 77   Recent Labs: No results found for requested labs within last 8760 hours.   Recent Lipid Panel Lab Results  Component Value Date/Time   CHOL 172 04/24/2019 09:22 AM   TRIG 112 04/24/2019 09:22 AM   HDL 71 04/24/2019 09:22 AM   CHOLHDL 2.4 04/24/2019 09:22 AM  [place] 79 04/24/2019 09:22 AM    Wt Readings from Last 3 Encounters:  07/06/21 66.7 kg  06/09/21 70.3 kg  02/08/21 70.5 kg     Objective:    Vital Signs:  BP 120/72   Pulse 77   Ht 5\' 4"  (1.626 m)   Wt 66.7 kg   SpO2 97%   BMI 25.23 kg/m    Affect appropriate Healthy:  appears stated age 53: normal Neck supple with no adenopathy JVP normal no bruits no thyromegaly Lungs clear with no wheezing and good diaphragmatic motion Heart:  S1/S2 no murmur, no rub, gallop or click PMI normal Abdomen: benighn, BS positve, no tenderness, no AAA no bruit.  No HSM or HJR Distal pulses intact with no bruits No edema Neuro non-focal Skin warm and dry No muscular weakness   ASSESSMENT & PLAN:    A fib permanent -  2017  found to be in a fib rate controlled and asymptomatic and remains the same. Weight > 60 kg, age > 38 needs CBC/BMET for dosing  Anticoagulation on Eliquis and no bleeding given age check CBC/BMET to make sure dosing appropriate  HLD well controlled Hypothyroidism TSH normal 04/24/19  Dementia:  on Aricept no longer living independently f/u Neuro   Medication  Adjustments/Labs and Tests Ordered: Current medicines are reviewed at length with the patient today.  Concerns regarding medicines are outlined above.   Tests Ordered: Orders Placed This Encounter  Procedures   CBC with Differential/Platelet   Basic metabolic panel   EKG 31-VQMG   CBC/PLT/BMET   Medication Changes: No orders of the defined types were placed in this encounter.   Disposition:  Follow up in a year   Signed, Jenkins Rouge, MD  07/06/2021 4:20 PM     [facility]

## 2021-07-06 ENCOUNTER — Ambulatory Visit: Payer: Medicare Other | Admitting: Cardiovascular Disease

## 2021-07-06 ENCOUNTER — Encounter: Payer: Self-pay | Admitting: Cardiovascular Disease

## 2021-07-06 ENCOUNTER — Other Ambulatory Visit: Payer: Self-pay

## 2021-07-06 VITALS — BP 120/72 | HR 77 | Ht 64.0 in | Wt 147.0 lb

## 2021-07-06 DIAGNOSIS — E782 Mixed hyperlipidemia: Secondary | ICD-10-CM

## 2021-07-06 DIAGNOSIS — Z7901 Long term (current) use of anticoagulants: Secondary | ICD-10-CM | POA: Diagnosis not present

## 2021-07-06 DIAGNOSIS — F0150 Vascular dementia without behavioral disturbance: Secondary | ICD-10-CM

## 2021-07-06 DIAGNOSIS — I482 Chronic atrial fibrillation, unspecified: Secondary | ICD-10-CM | POA: Diagnosis not present

## 2021-07-06 NOTE — Patient Instructions (Signed)
Medication Instructions:  *If you need a refill on your cardiac medications before your next appointment, please call your pharmacy*  Lab Work: If you have labs (blood work) drawn today and your tests are completely normal, you will receive your results only by: MyChart Message (if you have MyChart) OR A paper copy in the mail If you have any lab test that is abnormal or we need to change your treatment, we will call you to review the results.  Testing/Procedures: None ordered today.  Follow-Up: At [facility], you and your health needs are our priority.  As part of our continuing mission to provide you with exceptional heart care, we have created designated Provider Care Teams.  These Care Teams include your primary Cardiologist (physician) and Advanced Practice Providers (APPs -  Physician Assistants and Nurse Practitioners) who all work together to provide you with the care you need, when you need it.  We recommend signing up for the patient portal called "MyChart".  Sign up information is provided on this After Visit Summary.  MyChart is used to connect with patients for Virtual Visits (Telemedicine).  Patients are able to view lab/test results, encounter notes, upcoming appointments, etc.  Non-urgent messages can be sent to your provider as well.   To learn more about what you can do with MyChart, go to [email or web address].    Your next appointment:   6 month(s)  The format for your next appointment:   In Person  Provider:   You may see Peter Nishan, MD or one of the following Advanced Practice Providers on your designated Care Team:   Laura Ingold, NP  

## 2021-07-07 LAB — CBC WITH DIFFERENTIAL/PLATELET
Basophils Absolute: 0.1 10*3/uL (ref 0.0–0.2)
Basos: 1 %
EOS (ABSOLUTE): 0.1 10*3/uL (ref 0.0–0.4)
Eos: 2 %
Hematocrit: 40.8 % (ref 34.0–46.6)
Hemoglobin: 13.4 g/dL (ref 11.1–15.9)
Immature Grans (Abs): 0 10*3/uL (ref 0.0–0.1)
Immature Granulocytes: 0 %
Lymphocytes Absolute: 2.6 10*3/uL (ref 0.7–3.1)
Lymphs: 32 %
MCH: 31.1 pg (ref 26.6–33.0)
MCHC: 32.8 g/dL (ref 31.5–35.7)
MCV: 95 fL (ref 79–97)
Monocytes Absolute: 0.6 10*3/uL (ref 0.1–0.9)
Monocytes: 7 %
Neutrophils Absolute: 4.8 10*3/uL (ref 1.4–7.0)
Neutrophils: 58 %
Platelets: 220 10*3/uL (ref 150–450)
RBC: 4.31 x10E6/uL (ref 3.77–5.28)
RDW: 11.8 % (ref 11.7–15.4)
WBC: 8.2 10*3/uL (ref 3.4–10.8)

## 2021-07-07 LAB — BASIC METABOLIC PANEL
BUN/Creatinine Ratio: 17 (ref 12–28)
BUN: 15 mg/dL (ref 8–27)
CO2: 26 mmol/L (ref 20–29)
Calcium: 9.5 mg/dL (ref 8.7–10.3)
Chloride: 103 mmol/L (ref 96–106)
Creatinine, Ser: 0.89 mg/dL (ref 0.57–1.00)
Glucose: 97 mg/dL (ref 70–99)
Potassium: 4.2 mmol/L (ref 3.5–5.2)
Sodium: 147 mmol/L — ABNORMAL HIGH (ref 134–144)
eGFR: 65 mL/min/{1.73_m2} (ref >59)

## 2021-09-29 ENCOUNTER — Other Ambulatory Visit: Payer: Self-pay | Admitting: Cardiovascular Disease

## 2021-09-29 DIAGNOSIS — I4891 Unspecified atrial fibrillation: Secondary | ICD-10-CM

## 2021-09-29 MED ORDER — METOPROLOL TARTRATE 25 MG PO TABS: 12.5000 mg | ORAL_TABLET | Freq: Two times a day (BID) | ORAL | 2 refills | Status: DC

## 2021-09-29 NOTE — Telephone Encounter (Signed)
Eliquis 5mg  refill request received. Patient is 80 years old, weight-66.7kg, Crea- 0.89 on 07/06/2021, Diagnosis-Afib, and last seen by Dr. Johnsie Cancel on 07/06/2021. Dose is appropriate based on dosing criteria. Will send in refill to requested pharmacy.

## 2021-09-29 NOTE — Telephone Encounter (Signed)
Pt's medication was sent to pt's pharmacy as requested. Confirmation received.  °

## 2022-01-01 IMAGING — DX DG RIBS W/ CHEST 3+V*R*
3 series · 3 of 3 positions shown · non-contrast
Comparison: 12/15/2015 chest radiograph.

CLINICAL DATA: Fall 9 days prior with right lower posterior rib
pain

EXAM:
RIGHT RIBS AND CHEST - 3+ VIEW

[chest pa]
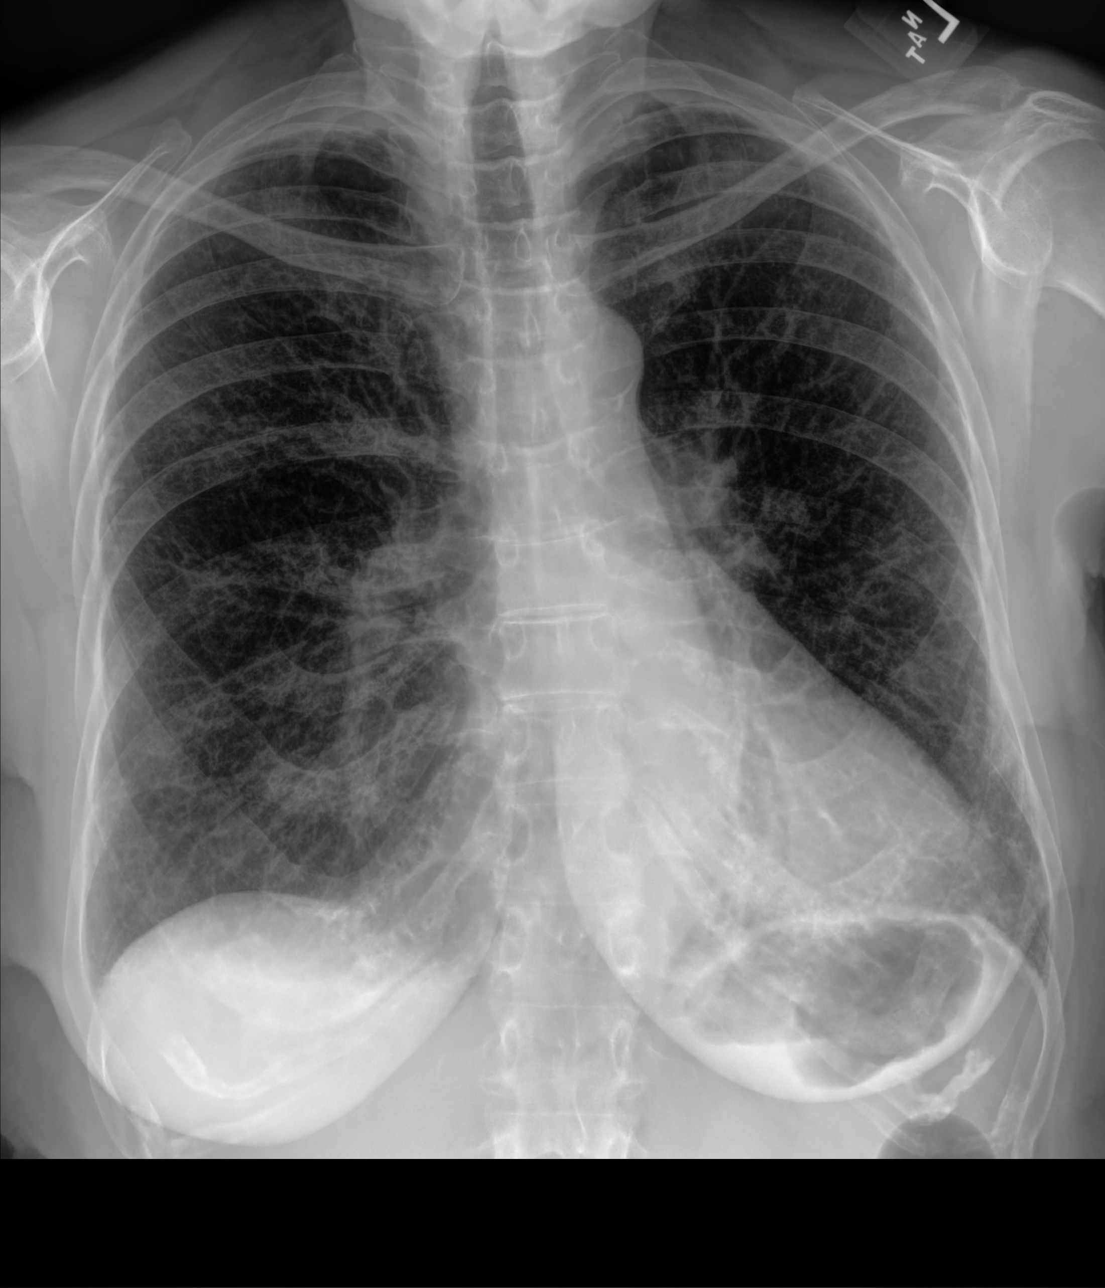

[hemithorax (ribs) ap (1 of 2)]
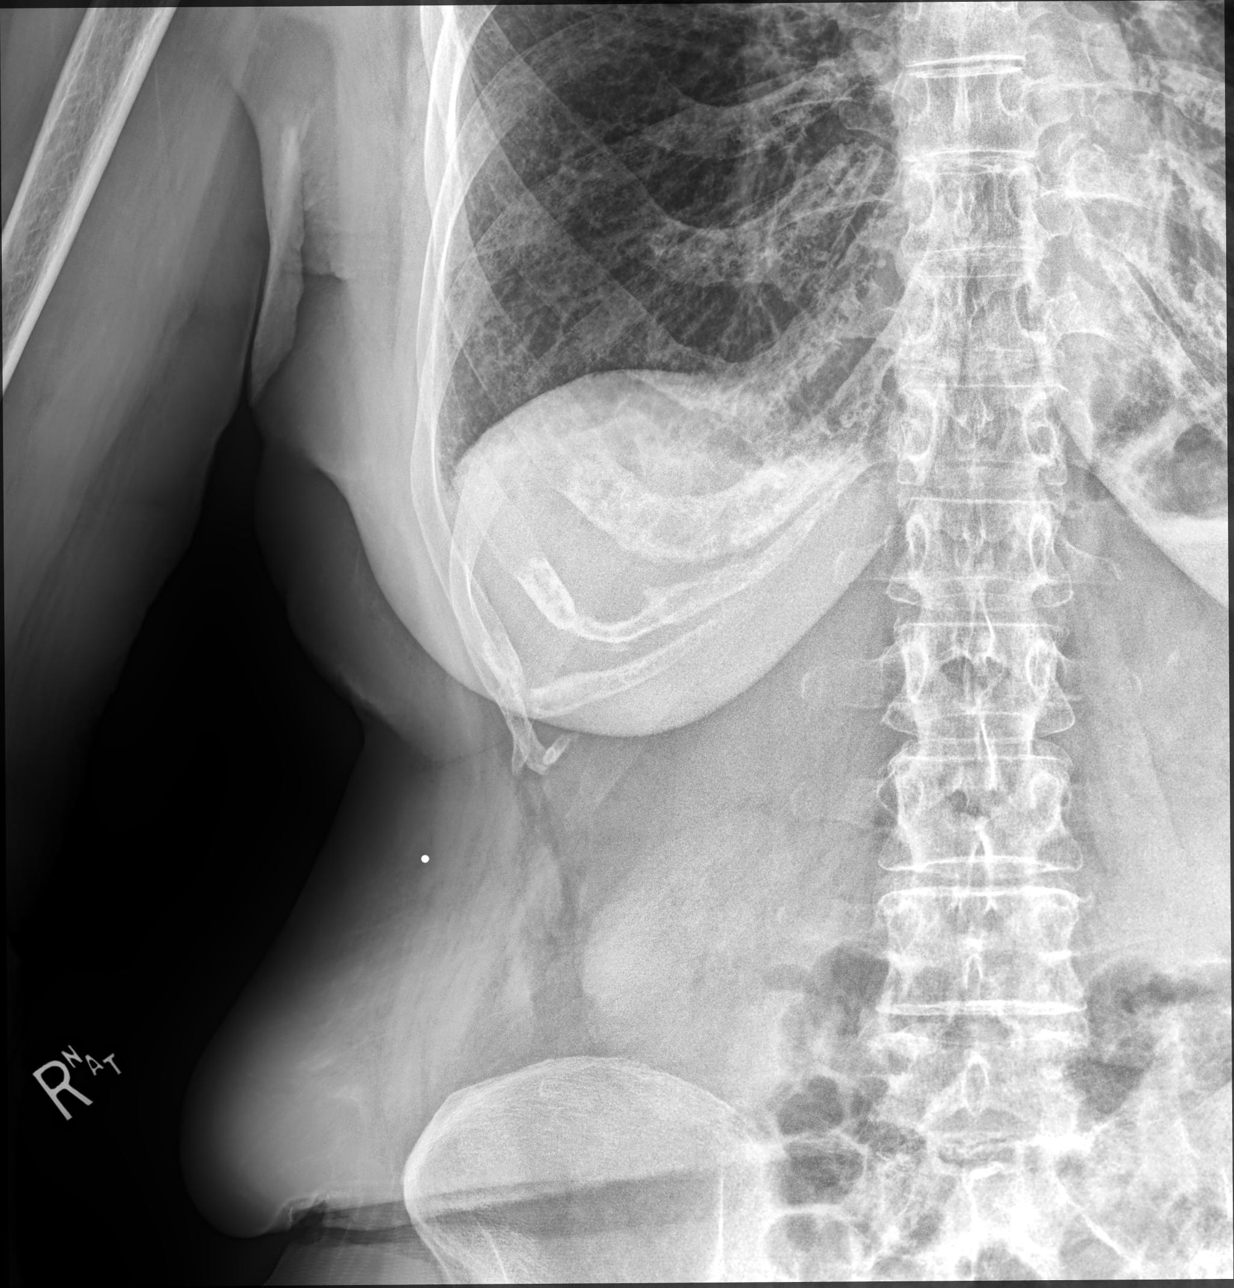

[hemithorax (ribs) ap (2 of 2)]
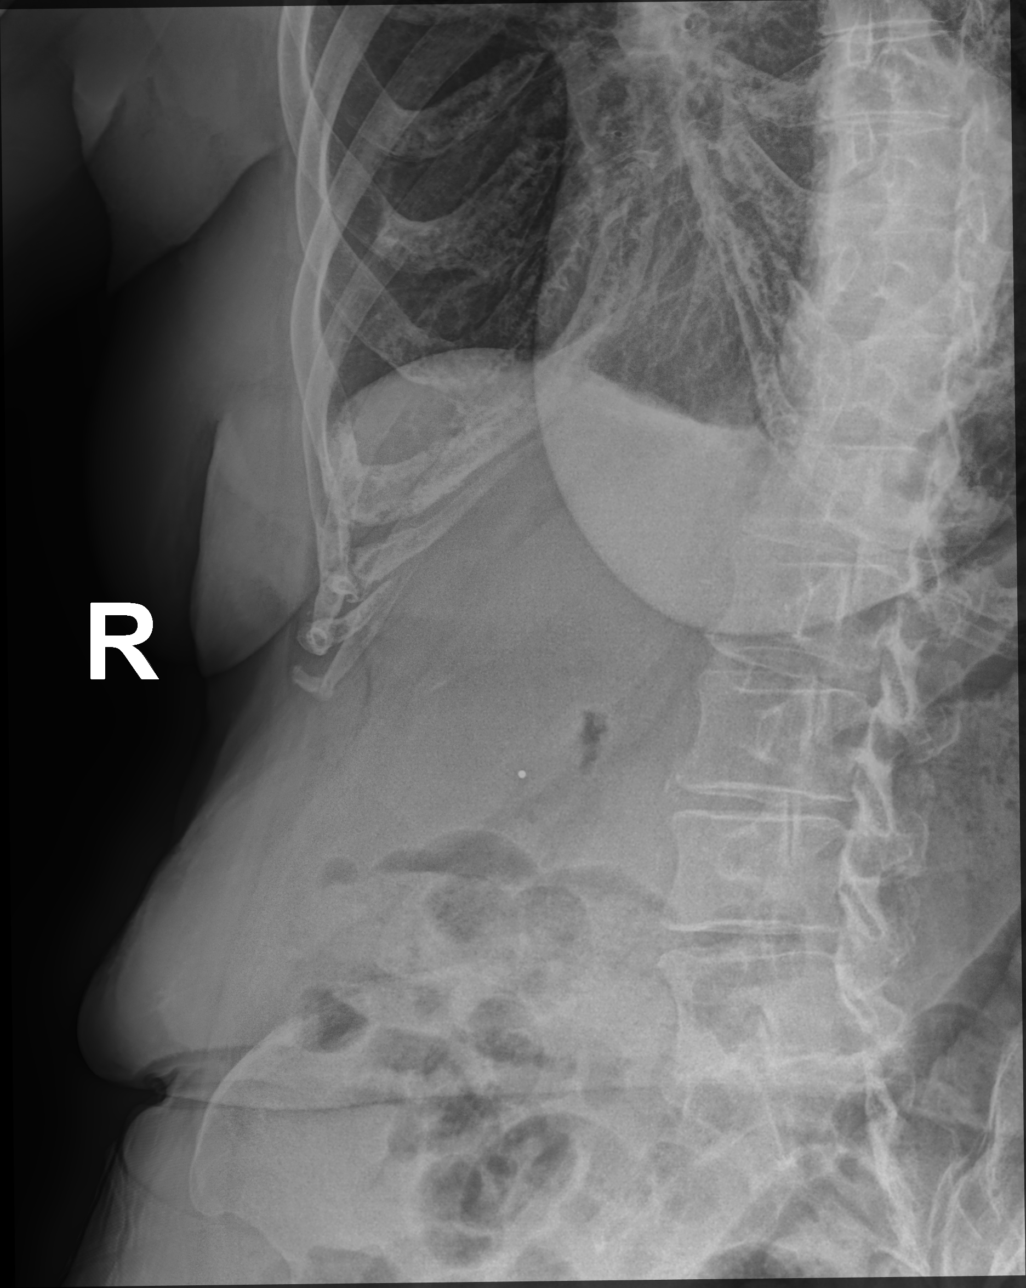

[3 of 3 positions shown; findings below may reference images not displayed]

FINDINGS: Stable cardiomediastinal silhouette with normal heart size. No
pneumothorax. No pleural effusion. Mild patchy peripheral reticular
opacities in both lungs, similar to slightly worsened. No pulmonary
edema. No acute consolidative airspace disease. The area of
symptomatic concern as indicated by the patient in the lower right
chest wall was denoted with a metallic skin BB by the technologist.
No fracture or focal osseous lesion is seen in the right ribs.
Posterior lower ribs are obscured by normal chondral calcification
in the anterior lower right ribs.
IMPRESSION: 1. No right rib fracture detected. Should the patient's symptoms
persist or worsen, repeat radiographs of the ribs in 10 - 14 days or
noncontrast chest CT maybe of use to detect subtle nondisplaced rib
fractures (which are commonly occult on initial imaging).
2. Mild patchy peripheral reticular opacities in both lungs, similar
to slightly worsened, cannot exclude interstitial lung disease.
High-resolution chest CT could be obtained for further evaluation as
clinically warranted.

## 2022-01-30 ENCOUNTER — Other Ambulatory Visit: Payer: Self-pay | Admitting: Neurology

## 2022-01-30 NOTE — Telephone Encounter (Signed)
Rx refilled.

## 2022-03-19 ENCOUNTER — Other Ambulatory Visit: Payer: Self-pay | Admitting: Cardiovascular Disease

## 2022-03-19 DIAGNOSIS — I4891 Unspecified atrial fibrillation: Secondary | ICD-10-CM

## 2022-03-20 NOTE — Telephone Encounter (Signed)
Prescription refill request for Eliquis received. Indication:Afib Last office visit:10/22 Scr:0.8 Age: 81 Weight:66.7 kg  Prescription refilled

## 2022-06-05 ENCOUNTER — Other Ambulatory Visit: Payer: Self-pay | Admitting: Neurology

## 2022-06-15 ENCOUNTER — Ambulatory Visit: Payer: Medicare Other | Admitting: Neurology

## 2022-06-27 ENCOUNTER — Other Ambulatory Visit: Payer: Self-pay | Admitting: Cardiovascular Disease
